# Patient Record
Sex: Female | Born: 1940 | Race: White | Hispanic: No | Marital: Married | Smoking: Never smoker
Health system: Southern US, Community
[De-identification: ages and names within clinical notes are randomized; demographics above are authoritative.]

## PROBLEM LIST (undated history)

## (undated) DIAGNOSIS — E785 Hyperlipidemia, unspecified: Secondary | ICD-10-CM

## (undated) DIAGNOSIS — C801 Malignant (primary) neoplasm, unspecified: Secondary | ICD-10-CM

## (undated) DIAGNOSIS — E119 Type 2 diabetes mellitus without complications: Secondary | ICD-10-CM

## (undated) DIAGNOSIS — M1990 Unspecified osteoarthritis, unspecified site: Secondary | ICD-10-CM

## (undated) DIAGNOSIS — F329 Major depressive disorder, single episode, unspecified: Secondary | ICD-10-CM

## (undated) DIAGNOSIS — I10 Essential (primary) hypertension: Secondary | ICD-10-CM

## (undated) DIAGNOSIS — F32A Depression, unspecified: Secondary | ICD-10-CM

## (undated) DIAGNOSIS — N189 Chronic kidney disease, unspecified: Secondary | ICD-10-CM

## (undated) HISTORY — DX: Essential (primary) hypertension: I10

## (undated) HISTORY — PX: APPENDECTOMY: SHX54

## (undated) HISTORY — DX: Unspecified osteoarthritis, unspecified site: M19.90

## (undated) HISTORY — DX: Type 2 diabetes mellitus without complications: E11.9

## (undated) HISTORY — PX: TONSILLECTOMY: SUR1361

## (undated) HISTORY — DX: Depression, unspecified: F32.A

## (undated) HISTORY — DX: Chronic kidney disease, unspecified: N18.9

## (undated) HISTORY — DX: Major depressive disorder, single episode, unspecified: F32.9

## (undated) HISTORY — DX: Hyperlipidemia, unspecified: E78.5

## (undated) HISTORY — DX: Malignant (primary) neoplasm, unspecified: C80.1

---

## 1997-09-11 ENCOUNTER — Other Ambulatory Visit: Admission: RE | Admit: 1997-09-11 | Discharge: 1997-09-11 | Payer: Self-pay | Admitting: *Deleted

## 1999-09-06 ENCOUNTER — Encounter: Payer: Self-pay | Admitting: *Deleted

## 1999-09-06 ENCOUNTER — Encounter: Admission: RE | Admit: 1999-09-06 | Discharge: 1999-09-06 | Payer: Self-pay | Admitting: *Deleted

## 2000-09-15 ENCOUNTER — Other Ambulatory Visit: Admission: RE | Admit: 2000-09-15 | Discharge: 2000-09-15 | Payer: Self-pay | Admitting: *Deleted

## 2000-11-06 ENCOUNTER — Ambulatory Visit (HOSPITAL_COMMUNITY): Admission: RE | Admit: 2000-11-06 | Discharge: 2000-11-06 | Payer: Self-pay | Admitting: Neurosurgery

## 2000-11-06 ENCOUNTER — Encounter: Payer: Self-pay | Admitting: Neurosurgery

## 2000-11-20 ENCOUNTER — Encounter: Payer: Self-pay | Admitting: Neurosurgery

## 2000-11-20 ENCOUNTER — Ambulatory Visit (HOSPITAL_COMMUNITY): Admission: RE | Admit: 2000-11-20 | Discharge: 2000-11-20 | Payer: Self-pay | Admitting: Neurosurgery

## 2001-02-16 ENCOUNTER — Ambulatory Visit (HOSPITAL_COMMUNITY): Admission: RE | Admit: 2001-02-16 | Discharge: 2001-02-16 | Payer: Self-pay | Admitting: Neurosurgery

## 2001-02-16 ENCOUNTER — Encounter: Payer: Self-pay | Admitting: Neurosurgery

## 2001-03-01 ENCOUNTER — Ambulatory Visit (HOSPITAL_COMMUNITY): Admission: RE | Admit: 2001-03-01 | Discharge: 2001-03-01 | Payer: Self-pay | Admitting: Neurosurgery

## 2001-03-01 ENCOUNTER — Encounter: Payer: Self-pay | Admitting: Neurosurgery

## 2001-09-23 ENCOUNTER — Other Ambulatory Visit: Admission: RE | Admit: 2001-09-23 | Discharge: 2001-09-23 | Payer: Self-pay | Admitting: Internal Medicine

## 2002-02-28 ENCOUNTER — Ambulatory Visit (HOSPITAL_COMMUNITY): Admission: RE | Admit: 2002-02-28 | Discharge: 2002-02-28 | Payer: Self-pay | Admitting: Neurosurgery

## 2002-02-28 ENCOUNTER — Encounter: Payer: Self-pay | Admitting: Neurosurgery

## 2002-06-15 ENCOUNTER — Encounter: Payer: Self-pay | Admitting: General Surgery

## 2002-06-15 ENCOUNTER — Ambulatory Visit (HOSPITAL_COMMUNITY): Admission: RE | Admit: 2002-06-15 | Discharge: 2002-06-15 | Payer: Self-pay | Admitting: General Surgery

## 2002-06-29 ENCOUNTER — Encounter (INDEPENDENT_AMBULATORY_CARE_PROVIDER_SITE_OTHER): Payer: Self-pay | Admitting: Specialist

## 2002-06-29 ENCOUNTER — Ambulatory Visit (HOSPITAL_BASED_OUTPATIENT_CLINIC_OR_DEPARTMENT_OTHER): Admission: RE | Admit: 2002-06-29 | Discharge: 2002-06-29 | Payer: Self-pay | Admitting: General Surgery

## 2002-06-29 ENCOUNTER — Encounter: Payer: Self-pay | Admitting: General Surgery

## 2002-08-03 ENCOUNTER — Ambulatory Visit (HOSPITAL_BASED_OUTPATIENT_CLINIC_OR_DEPARTMENT_OTHER): Admission: RE | Admit: 2002-08-03 | Discharge: 2002-08-04 | Payer: Self-pay | Admitting: General Surgery

## 2002-08-04 ENCOUNTER — Encounter (INDEPENDENT_AMBULATORY_CARE_PROVIDER_SITE_OTHER): Payer: Self-pay | Admitting: Specialist

## 2002-08-17 ENCOUNTER — Encounter: Payer: Self-pay | Admitting: Oncology

## 2002-08-17 ENCOUNTER — Encounter: Admission: RE | Admit: 2002-08-17 | Discharge: 2002-08-17 | Payer: Self-pay | Admitting: Oncology

## 2002-09-15 ENCOUNTER — Ambulatory Visit: Admission: RE | Admit: 2002-09-15 | Discharge: 2002-09-15 | Payer: Self-pay | Admitting: Oncology

## 2002-10-10 ENCOUNTER — Encounter: Payer: Self-pay | Admitting: Oncology

## 2002-10-10 ENCOUNTER — Ambulatory Visit (HOSPITAL_COMMUNITY): Admission: RE | Admit: 2002-10-10 | Discharge: 2002-10-10 | Payer: Self-pay | Admitting: Oncology

## 2002-10-18 ENCOUNTER — Other Ambulatory Visit: Admission: RE | Admit: 2002-10-18 | Discharge: 2002-10-18 | Payer: Self-pay | Admitting: Internal Medicine

## 2002-12-28 ENCOUNTER — Encounter: Admission: RE | Admit: 2002-12-28 | Discharge: 2002-12-28 | Payer: Self-pay | Admitting: Oncology

## 2003-07-19 ENCOUNTER — Encounter: Admission: RE | Admit: 2003-07-19 | Discharge: 2003-07-19 | Payer: Self-pay | Admitting: Oncology

## 2004-01-08 ENCOUNTER — Encounter: Admission: RE | Admit: 2004-01-08 | Discharge: 2004-01-08 | Payer: Self-pay | Admitting: Oncology

## 2004-02-01 ENCOUNTER — Other Ambulatory Visit: Admission: RE | Admit: 2004-02-01 | Discharge: 2004-02-01 | Payer: Self-pay | Admitting: *Deleted

## 2004-05-03 ENCOUNTER — Ambulatory Visit: Payer: Self-pay | Admitting: Oncology

## 2004-05-31 LAB — HM COLONOSCOPY: HM Colonoscopy: NORMAL

## 2004-07-08 ENCOUNTER — Encounter: Admission: RE | Admit: 2004-07-08 | Discharge: 2004-07-08 | Payer: Self-pay | Admitting: Oncology

## 2004-11-01 ENCOUNTER — Ambulatory Visit: Payer: Self-pay | Admitting: Oncology

## 2005-01-06 ENCOUNTER — Encounter: Admission: RE | Admit: 2005-01-06 | Discharge: 2005-01-06 | Payer: Self-pay | Admitting: Oncology

## 2005-05-02 ENCOUNTER — Ambulatory Visit: Payer: Self-pay | Admitting: Oncology

## 2005-05-12 ENCOUNTER — Other Ambulatory Visit: Admission: RE | Admit: 2005-05-12 | Discharge: 2005-05-12 | Payer: Self-pay | Admitting: Obstetrics & Gynecology

## 2006-01-01 ENCOUNTER — Ambulatory Visit: Payer: Self-pay | Admitting: Oncology

## 2006-01-05 ENCOUNTER — Ambulatory Visit (HOSPITAL_COMMUNITY): Admission: RE | Admit: 2006-01-05 | Discharge: 2006-01-05 | Payer: Self-pay | Admitting: Oncology

## 2006-09-10 ENCOUNTER — Other Ambulatory Visit: Admission: RE | Admit: 2006-09-10 | Discharge: 2006-09-10 | Payer: Self-pay | Admitting: Obstetrics & Gynecology

## 2006-12-31 ENCOUNTER — Ambulatory Visit: Payer: Self-pay | Admitting: Oncology

## 2007-01-04 ENCOUNTER — Ambulatory Visit (HOSPITAL_COMMUNITY): Admission: RE | Admit: 2007-01-04 | Discharge: 2007-01-04 | Payer: Self-pay | Admitting: Oncology

## 2007-07-01 ENCOUNTER — Ambulatory Visit: Payer: Self-pay | Admitting: Oncology

## 2008-01-07 ENCOUNTER — Ambulatory Visit: Payer: Self-pay | Admitting: Oncology

## 2008-01-11 ENCOUNTER — Ambulatory Visit (HOSPITAL_COMMUNITY): Admission: RE | Admit: 2008-01-11 | Discharge: 2008-01-11 | Payer: Self-pay | Admitting: Oncology

## 2009-01-22 ENCOUNTER — Ambulatory Visit: Payer: Self-pay | Admitting: Oncology

## 2009-05-25 ENCOUNTER — Encounter (INDEPENDENT_AMBULATORY_CARE_PROVIDER_SITE_OTHER): Payer: Self-pay | Admitting: *Deleted

## 2009-06-04 ENCOUNTER — Encounter: Admission: RE | Admit: 2009-06-04 | Discharge: 2009-06-04 | Payer: Self-pay | Admitting: Internal Medicine

## 2010-01-18 ENCOUNTER — Ambulatory Visit: Payer: Self-pay | Admitting: Oncology

## 2010-02-16 ENCOUNTER — Encounter: Payer: Self-pay | Admitting: Oncology

## 2010-02-17 ENCOUNTER — Encounter: Payer: Self-pay | Admitting: Oncology

## 2010-02-28 NOTE — Letter (Signed)
Summary: Colonoscopy Date Change Letter  [facility]  [address] [place], [place] [address]   Phone: [phone]  Fax: [phone]      May 25, 2009 MRN: [id number]   Lori Sharp  [address] [place], [place]  [address]   Dear Ms. GARTNER ,   Previously you were recommended to have a repeat colonoscopy around this time. Your chart was recently reviewed by Dr. Judie Petit T. Russella Dar of [facility]. Follow up colonoscopy is now recommended in May 2014. This revised recommendation is based on current, nationally recognized guidelines for colorectal cancer screening and polyp surveillance. These guidelines are endorsed by the American Cancer Society, [employer] on Colorectal Cancer as well as numerous other major medical organizations.  Please understand that our recommendation assumes that you do not have any new symptoms such as bleeding, a change in bowel habits, anemia, or significant abdominal discomfort. If you do have any concerning GI symptoms or want to discuss the guideline recommendations, please call to arrange an office visit at your earliest convenience. Otherwise we will keep you in our reminder system and contact you 1-2 months prior to the date listed above to schedule your next colonoscopy.  Thank you,  Judie Petit T. Russella Dar, M.D.  [facility] [phone]

## 2010-06-14 NOTE — Op Note (Signed)
NAME:  Lori Sharp, Lori Sharp                      ACCOUNT NO.:  [id number]   MEDICAL RECORD NO.:  [id number]                   PATIENT TYPE:  AMB   LOCATION:  DSC                                  FACILITY:  [facility]   PHYSICIAN:  Rose Phi. Maple Hudson, M.D.                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   OPERATION:  Completion of right axillary lymph node dissection.   SURGEON:  Rose Phi. Maple Hudson, M.D.   ANESTHESIA:  General.   INDICATIONS FOR PROCEDURE:  This patient had presented with a melanoma of  the right supraclavicular area which had been excised by Dr. Karlyn Agee. This  showed the original being about 0.98 mm. The sentinel lymph node had a  microscopic focus of cells not visible on H and E but present on the  immunohistochemical stains, and for that reason we are doing a completion  axillary node dissection.   DESCRIPTION OF PROCEDURE:  After suitable general anesthesia was induced the  patient was placed in the supine position with the right arm extended on the  arm board. The right axilla was then prepped and draped out in the standard  fashion.   The axillary incision which was made for the sentinel lymph node biopsy was  then extended medially and laterally and we then exposed the pectoralis  major muscle. I then dissected along the pectoralis major retracting it and  exposing the clavipectoral fascia over the axillary vein. I divided that and  exposed the pectoralis minor which we dissected and retracted and then  dissected out of the axillary tissue from inferior to the vein and then from  beneath the pectoralis minor muscle doing a complete level 1 and level 2  node dissection with  preservation of the long thoracic and thoracodorsal  nerves. The other  vessels and nerves were clipped and divided.  A level 3  dissection was not appropriate here with minimal microscopic focus in a  lymph node.   Following  the completion of the dissection we had good hemostasis. We  thoroughly irrigated the field with saline. A 19 [place] Blake drain was  inserted and brought out through a separate stab wound. The subcutaneous  tissue was closed with 3-0 Vicryl and the skin  with staples. A dressing was  applied.   The patient was transferred to the recovery room in satisfactory condition.  She tolerated  the procedure well.  Rose Phi. Maple Hudson, M.D.    PRY/MEDQ  D:  08/03/2002  T:  08/03/2002  Job:  [id number]   cc:   Jillyn Hidden B. Truett Perna, M.D.  [address] [facility]  [place]  [place]  [address]  Fax: [phone]   Garrison Columbus. Yetta Barre, M.D.  [address] [place]  [place] [address]  Fax: [phone]   Lovenia Kim, D.O.  [address], [place]  [place]  [place] [address]  Fax: [phone]

## 2010-06-14 NOTE — Op Note (Signed)
NAME:  Lori Sharp, Lori Sharp                      ACCOUNT NO.:  [id number]   MEDICAL RECORD NO.:  [id number]                   PATIENT TYPE:  AMB   LOCATION:  DSC                                  FACILITY:  [facility]   PHYSICIAN:  Rose Phi. Maple Hudson, M.D.                DATE OF BIRTH:  May 25, 1940   DATE OF PROCEDURE:  06/29/2002  DATE OF DISCHARGE:                                 OPERATIVE REPORT   PREOPERATIVE DIAGNOSIS:  Melanoma of the right supraclavicular area.   POSTOPERATIVE DIAGNOSIS:  Melanoma of the right supraclavicular area.   OPERATION PERFORMED:  1. Blue dye injection.  2. Right axillary sentinel lymph node biopsy.  3. Wide excision of melanoma of right supraclavicular space.   SURGEON:  Rose Phi. Maple Hudson, M.D.   ANESTHESIA:  General.   DESCRIPTION OF PROCEDURE:  This patient had presented with a melanoma that  had been biopsied and was 0.98 mm thick without ulceration.  A preoperative  lymphoscintigram had identified the right axilla as the site of her sentinel  lymph nodes.  She was then scheduled for her surgery.   After suitable general anesthesia was induced, the patient was placed in the  supine position.  I injected 1mL of Lymphazurin blue intradermally around  the previous melanoma biopsy site. We then prepped the axilla and neck and  draped out in a standard fashion.  Careful scanning of the axilla revealed a  hot spot with the Neoprobe.  A short axillary incision was made with  dissection through the subcutaneous tissue to the clavipectoral fascia.  Just beneath the clavipectoral fascia was a quite hot lymph node with counts  in excess of 600.  It did not have any blue dye in it.  I excised that node  as a sentinel node.  Careful palpation and scanning revealed no other hot or  palpable nodes.  That incision was then closed with 3-0 Vicryl and  subcuticular 4-0 Monocryl with Steri-Strips.   We then outlined a 1 cm margin of excision around the supraclavicular  scar  from her previous excision.  I then excised that.  Hemostasis was obtained  with the cautery.  This gave a 3 x 5 cm defect.  It was closed in a single  layer of interrupted 4-0 nylon sutures.  Dressings applied.  The patient was  then transferred to the recovery room in satisfactory condition having  tolerated the procedure well.                                                Rose Phi. Maple Hudson, M.D.    PRY/MEDQ  D:  06/29/2002  T:  06/29/2002  Job:  [id number]   cc:   Lovenia Kim, D.O.  [address]  [place] [address]  Fax: [phone]   Garrison Columbus. Yetta Barre, M.D.  [address] [place]  [place] [address]  Fax: [phone]

## 2010-12-20 ENCOUNTER — Telehealth: Payer: Self-pay | Admitting: Oncology

## 2010-12-20 NOTE — Telephone Encounter (Signed)
S/w the pt and she is aware of her r/s dec appts to [facility] 2013

## 2011-01-27 ENCOUNTER — Ambulatory Visit: Payer: Self-pay | Admitting: Oncology

## 2011-02-04 ENCOUNTER — Ambulatory Visit: Payer: Self-pay | Admitting: Oncology

## 2011-02-06 ENCOUNTER — Ambulatory Visit (HOSPITAL_BASED_OUTPATIENT_CLINIC_OR_DEPARTMENT_OTHER): Payer: Self-pay | Admitting: Oncology

## 2011-02-06 VITALS — BP 144/69 | HR 77 | Temp 97.1°F | Ht 61.5 in | Wt 149.3 lb

## 2011-02-06 DIAGNOSIS — C439 Malignant melanoma of skin, unspecified: Secondary | ICD-10-CM

## 2011-02-06 DIAGNOSIS — Z85820 Personal history of malignant melanoma of skin: Secondary | ICD-10-CM

## 2011-02-06 NOTE — Progress Notes (Signed)
OFFICE PROGRESS NOTE   INTERVAL HISTORY:   She returns as scheduled. She continues to work Chief Technology Officer. There is no edema at the right arm. She continues followup with Dr. Yetta Barre. She has a good appetite and energy level.  She is followed closely by Dr. Ludwig Clarks and reports she discovered a mild renal dysfunction.  Objective:  Vital signs in last 24 hours:  Blood pressure 144/69, pulse 77, temperature 97.1 F (36.2 C), temperature source Oral, height 5' 1.5" (1.562 m), weight 149 lb 4.8 oz (67.722 kg).    HEENT: Neck without mass Lymphatics: No cervical, supraclavicular, or axillary nodes Resp: Lungs clear bilaterally Cardio: Regular rate and rhythm GI: The abdomen is nontender. No hepatomegaly. Vascular: No leg edema. The right arm is slightly larger than the left side. No hand edema.  Skin: Right lower neck scar without evidence of local tumor recurrence.    Medications: I have reviewed the patient's current medications.  Assessment/Plan: Ms Rotundo was diagnosed with stage III melanoma in May 2004.  There is no clinical evidence of recurrent melanoma.    Disposition:  She remains in remission from the melanoma. She plans to continue clinical followup with Dr. Yetta Barre and Dr.Moreira. We did not schedule a followup appointment at the cancer Center. We will see her in the future as needed.   Lucile Shutters, MD  02/06/2011  5:18 PM

## 2012-01-22 ENCOUNTER — Ambulatory Visit
Admission: RE | Admit: 2012-01-22 | Discharge: 2012-01-22 | Disposition: A | Discharge status: Home / Self Care | Payer: Medicare Other | Source: Ambulatory Visit | Attending: Internal Medicine | Admitting: Internal Medicine

## 2012-01-22 ENCOUNTER — Other Ambulatory Visit: Payer: Self-pay | Admitting: Internal Medicine

## 2012-01-22 DIAGNOSIS — R609 Edema, unspecified: Secondary | ICD-10-CM

## 2012-03-01 LAB — HM MAMMOGRAPHY: HM Mammogram: NORMAL

## 2012-06-01 ENCOUNTER — Encounter: Payer: Self-pay | Admitting: Gastroenterology

## 2013-09-02 LAB — BASIC METABOLIC PANEL
Creatinine: 1 mg/dL (ref <=1.1)
Glucose: 99 mg/dL
Potassium: 3.6 mmol/L (ref 3.4–5.3)
Sodium: 136 mmol/L — AB (ref 137–147)

## 2013-09-02 LAB — HEPATIC FUNCTION PANEL
ALT: 14 U/L (ref 7–35)
AST: 16 U/L (ref 13–35)
Alkaline Phosphatase: 48 U/L (ref 25–125)
Bilirubin, Total: 0.8 mg/dL

## 2013-09-02 LAB — HEMOGLOBIN A1C: Hgb A1c MFr Bld: 5.9 % (ref 4.0–6.0)

## 2013-09-02 LAB — LIPID PANEL
Cholesterol: 166 mg/dL (ref 0–200)
HDL: 54 mg/dL (ref 35–70)
LDL Cholesterol: 84 mg/dL
Triglycerides: 141 mg/dL (ref 40–160)

## 2013-09-10 LAB — HEMOGLOBIN A1C: Hgb A1c MFr Bld: 5.9 % (ref 4.0–6.0)

## 2013-09-23 ENCOUNTER — Encounter: Payer: Self-pay | Admitting: Family Medicine

## 2013-09-23 DIAGNOSIS — C439 Malignant melanoma of skin, unspecified: Secondary | ICD-10-CM

## 2013-09-23 DIAGNOSIS — F329 Major depressive disorder, single episode, unspecified: Secondary | ICD-10-CM

## 2013-09-23 DIAGNOSIS — N183 Chronic kidney disease, stage 3 unspecified: Secondary | ICD-10-CM

## 2013-09-23 DIAGNOSIS — R739 Hyperglycemia, unspecified: Secondary | ICD-10-CM | POA: Insufficient documentation

## 2013-09-23 DIAGNOSIS — I159 Secondary hypertension, unspecified: Secondary | ICD-10-CM

## 2013-09-23 DIAGNOSIS — I10 Essential (primary) hypertension: Secondary | ICD-10-CM | POA: Insufficient documentation

## 2013-09-23 DIAGNOSIS — E785 Hyperlipidemia, unspecified: Secondary | ICD-10-CM | POA: Insufficient documentation

## 2013-09-23 DIAGNOSIS — N1330 Unspecified hydronephrosis: Secondary | ICD-10-CM | POA: Insufficient documentation

## 2013-09-23 DIAGNOSIS — I8390 Asymptomatic varicose veins of unspecified lower extremity: Secondary | ICD-10-CM | POA: Insufficient documentation

## 2013-09-23 DIAGNOSIS — F330 Major depressive disorder, recurrent, mild: Secondary | ICD-10-CM | POA: Insufficient documentation

## 2013-09-23 DIAGNOSIS — F32A Depression, unspecified: Secondary | ICD-10-CM

## 2013-09-23 DIAGNOSIS — Z85820 Personal history of malignant melanoma of skin: Secondary | ICD-10-CM | POA: Insufficient documentation

## 2013-09-28 ENCOUNTER — Telehealth: Payer: Self-pay

## 2013-09-28 NOTE — Telephone Encounter (Signed)
Pharmacy is requesting refill on pt Vit D2 1.25mg  #4, take 1 capsule every week. Is this ok to fill?

## 2013-09-28 NOTE — Telephone Encounter (Signed)
Please let patient we will check vitamin D at her first visit or first time we do labs and make a decision on if she needs weekly therapy at that time or if she is adequately repleted.

## 2013-09-28 NOTE — Telephone Encounter (Signed)
Pt states that she got her other doctor to fill this who origianlly Rxd it for her

## 2013-10-11 ENCOUNTER — Ambulatory Visit (INDEPENDENT_AMBULATORY_CARE_PROVIDER_SITE_OTHER): Payer: Medicare Other | Admitting: Internal Medicine

## 2013-10-11 VITALS — BP 138/78 | HR 86 | Temp 98.1°F | Resp 18 | Ht 61.0 in | Wt 133.6 lb

## 2013-10-11 DIAGNOSIS — R634 Abnormal weight loss: Secondary | ICD-10-CM

## 2013-10-11 DIAGNOSIS — F329 Major depressive disorder, single episode, unspecified: Secondary | ICD-10-CM

## 2013-10-11 DIAGNOSIS — Z818 Family history of other mental and behavioral disorders: Secondary | ICD-10-CM

## 2013-10-11 DIAGNOSIS — F32A Depression, unspecified: Secondary | ICD-10-CM

## 2013-10-11 DIAGNOSIS — F3289 Other specified depressive episodes: Secondary | ICD-10-CM

## 2013-10-11 DIAGNOSIS — R42 Dizziness and giddiness: Secondary | ICD-10-CM

## 2013-10-11 LAB — POCT CBC
Granulocyte percent: 63.9 %G (ref 37–80)
HCT, POC: 40 % (ref 37.7–47.9)
Hemoglobin: 13.3 g/dL (ref 12.2–16.2)
Lymph, poc: 2.3 (ref 0.6–3.4)
MCH, POC: 28.8 pg (ref 27–31.2)
MCHC: 33.2 g/dL (ref 31.8–35.4)
MCV: 86.8 fL (ref 80–97)
MID (cbc): 0.5 (ref 0–0.9)
MPV: 7.2 fL (ref 0–99.8)
POC Granulocyte: 5 (ref 2–6.9)
POC LYMPH PERCENT: 29.2 %L (ref 10–50)
POC MID %: 6.9 %M (ref 0–12)
Platelet Count, POC: 241 10*3/uL (ref 142–424)
RBC: 4.61 M/uL (ref 4.04–5.48)
RDW, POC: 13 %
WBC: 7.9 10*3/uL (ref 4.6–10.2)

## 2013-10-11 LAB — POCT SEDIMENTATION RATE: POCT SED RATE: 30 mm/hr — AB (ref 0–22)

## 2013-10-11 MED ORDER — ESCITALOPRAM OXALATE 20 MG PO TABS: 20.0000 mg | ORAL_TABLET | Freq: Every day | ORAL | Status: DC

## 2013-10-11 NOTE — Progress Notes (Signed)
Subjective:    Patient ID: Lori Sharp, female    DOB: [birth year], 73 y.o.   MRN: [id number]  HPI  73 year old female married and lives with husband who is not well per patient.  Pt presents to clinic today with complaints of questioning pt's anti-depressant medication is not working and needs to be increased. For the 2 months pt has noticed she is not eating well and over the past 2 weeks pt states he has been loosing weight.  Current weight 133.6 lbs in clinic fully dressed today.  Weight in January 2013 was 149 lbs when seen by oncologist Dr. Benay Spice.  Pt states she is dizzy and feels she cries all the time with shakiness and weakness.  Pt feels her dizziness is not walking straight, feels she spins - not the room spinning.  Pt feels tearful all the time without any particular stimulus.  Pt states she has been on antidepressants since her cancer was diagnosed in 2004.  Pt takes Celexa 10mg  at bedtime - states it makes her sleepy and does not take in daytime. Celexa is prescribed by Dr. Benay Spice and Dr. Baldomero Lamy -former primary care MD.  Dr. Benay Spice saw pt for stage 3 melanoma of the Right shoulder/arm.  Pt states she had 16 lymph nodes removed from right axillary area.  Pt to see Dr.[facility]unter [facility] November 21, 2013 to establish primary care with this practice due to close proximity to where pt lives - pt does not drive and husband unable to drive at this time either.       Review of Systems     Objective:   Physical Exam  Constitutional: She is oriented to person, place, and time. She appears well-developed and well-nourished. She appears distressed.  HENT:  Head: Normocephalic.  Nose: Nose normal.  Eyes: Conjunctivae and EOM are normal. Pupils are equal, round, and reactive to light.  Neck: Normal range of motion. Neck supple.  Cardiovascular: Normal rate and normal heart sounds.   Pulmonary/Chest: Effort normal and breath sounds normal.  Abdominal: Soft.    Musculoskeletal: Normal range of motion.  Neurological: She is alert and oriented to person, place, and time. No cranial nerve deficit. She exhibits normal muscle tone. Coordination normal.  Psychiatric: Her speech is normal and behavior is normal. Judgment and thought content normal. Cognition and memory are normal. She exhibits a depressed mood.   BP 132/78 Results for orders placed in visit on 10/11/13  POCT CBC      Result Value Ref Range   WBC 7.9  4.6 - 10.2 K/uL   Lymph, poc 2.3  0.6 - 3.4   POC LYMPH PERCENT 29.2  10 - 50 %L   MID (cbc) 0.5  0 - 0.9   POC MID % 6.9  0 - 12 %M   POC Granulocyte 5.0  2 - 6.9   Granulocyte percent 63.9  37 - 80 %G   RBC 4.61  4.04 - 5.48 M/uL   Hemoglobin 13.3  12.2 - 16.2 g/dL   HCT, POC 40.0  37.7 - 47.9 %   MCV 86.8  80 - 97 fL   MCH, POC 28.8  27 - 31.2 pg   MCHC 33.2  31.8 - 35.4 g/dL   RDW, POC 13.0     Platelet Count, POC 241  142 - 424 K/uL   MPV 7.2  0 - 99.8 fL         Assessment & Plan:  Dizzy/Melanoma/Depression/Family stress  Trial of brand Lexapro 20mg  qd Support group for dementia Exercise/Church group

## 2013-10-11 NOTE — Progress Notes (Deleted)
   Subjective:    Patient ID: Lori Sharp, female    DOB: [birth year], 73 y.o.   MRN: [id number]  HPI    Review of Systems     Objective:   Physical Exam        Assessment & Plan:

## 2013-10-11 NOTE — Patient Instructions (Addendum)
Adjustment Disorder Most changes in life can cause stress. Getting used to changes may take a few months or longer. If feelings of stress, hopelessness, or worry continue, you may have an adjustment disorder. This stress-related mental health problem may affect your feelings, thinking and how you act. It occurs in both sexes and happens at any age. SYMPTOMS  Some of the following problems may be seen and vary from person to person:  Sadness or depression.  Loss of enjoyment.  Thoughts of suicide.  Fighting.  Avoiding family and friends.  Poor school performance.  Hopelessness, sense of loss.  Trouble sleeping.  Vandalism.  Worry, weight loss or gain.  Crying spells.  Anxiety  Reckless driving.  Skipping school.  Poor work Systems analyst.  Nervousness.  Ignoring bills.  Poor attitude. DIAGNOSIS  Your caregiver will ask what has happened in your life and do a physical exam. They will make a diagnosis of an adjustment disorder when they are sure another problem or medical illness causing your feelings does not exist. TREATMENT  When problems caused by stress interfere with you daily life or last longer than a few months, you may need counseling for an adjustment disorder. Early treatment may diminish problems and help you to better cope with the stressful events in your life. Sometimes medication is necessary. Individual counseling and or support groups can be very helpful. PROGNOSIS  Adjustment disorders usually last less than 3 to 6 months. The condition may persist if there is long lasting stress. This could include health problems, relationship problems, or job difficulties where you can not easily escape from what is causing the problem. PREVENTION  Even the most mentally healthy, highly functioning people can suffer from an adjustment disorder given a significant blow from a life-changing event. There is no way to prevent pain and loss. Most people need help from time  to time. You are not alone. SEEK MEDICAL CARE IF:  Your feelings or symptoms listed above do not improve or worsen. Document Released: 09/17/2005 Document Revised: 04/07/2011 Document Reviewed: 12/09/2006 [facility] Patient Information 2015 [place], [place]. This information is not intended to replace advice given to you by your health care provider. Make sure you discuss any questions you have with your health care provider. Stress Stress-related medical problems are becoming increasingly common. The body has a built-in physical response to stressful situations. Faced with pressure, challenge or danger, we need to react quickly. Our bodies release hormones such as cortisol and adrenaline to help do this. These hormones are part of the "fight or flight" response and affect the metabolic rate, heart rate and blood pressure, resulting in a heightened, stressed state that prepares the body for optimum performance in dealing with a stressful situation. It is likely that early man required these mechanisms to stay alive, but usually modern stresses do not call for this, and the same hormones released in today's world can damage health and reduce coping ability. CAUSES  Pressure to perform at work, at school or in sports.  Threats of physical violence.  Money worries.  Arguments.  Family conflicts.  Divorce or separation from significant other.  Bereavement.  New job or unemployment.  Changes in location.  Alcohol or drug abuse. SOMETIMES, THERE IS NO PARTICULAR REASON FOR DEVELOPING STRESS. Almost all people are at risk of being stressed at some time in their lives. It is important to know that some stress is temporary and some is long term.  Temporary stress will go away when a situation is  resolved. Most people can cope with short periods of stress, and it can often be relieved by relaxing, taking a walk or getting any type of exercise, chatting through issues with friends, or having a  good night's sleep.  Chronic (long-term, continuous) stress is much harder to deal with. It can be psychologically and emotionally damaging. It can be harmful both for an individual and for friends and family. SYMPTOMS Everyone reacts to stress differently. There are some common effects that help [place] recognize it. In times of extreme stress, people may:  Shake uncontrollably.  Breathe faster and deeper than normal (hyperventilate).  Vomit.  For people with asthma, stress can trigger an attack.  For some people, stress may trigger migraine headaches, ulcers, and body pain. PHYSICAL EFFECTS OF STRESS MAY INCLUDE:  Loss of energy.  Skin problems.  Aches and pains resulting from tense muscles, including neck ache, backache and tension headaches.  Increased pain from arthritis and other conditions.  Irregular heart beat (palpitations).  Periods of irritability or anger.  Apathy or depression.  Anxiety (feeling uptight or worrying).  Unusual behavior.  Loss of appetite.  Comfort eating.  Lack of concentration.  Loss of, or decreased, sex-drive.  Increased smoking, drinking, or recreational drug use.  For women, missed periods.  Ulcers, joint pain, and muscle pain. Post-traumatic stress is the stress caused by any serious accident, strong emotional damage, or extremely difficult or violent experience such as rape or war. Post-traumatic stress victims can experience mixtures of emotions such as fear, shame, depression, guilt or anger. It may include recurrent memories or images that may be haunting. These feelings can last for weeks, months or even years after the traumatic event that triggered them. Specialized treatment, possibly with medicines and psychological therapies, is available. If stress is causing physical symptoms, severe distress or making it difficult for you to function as normal, it is worth seeing your caregiver. It is important to remember that although  stress is a usual part of life, extreme or prolonged stress can lead to other illnesses that will need treatment. It is better to visit a doctor sooner rather than later. Stress has been linked to the development of high blood pressure and heart disease, as well as insomnia and depression. There is no diagnostic test for stress since everyone reacts to it differently. But a caregiver will be able to spot the physical symptoms, such as:  Headaches.  Shingles.  Ulcers. Emotional distress such as intense worry, low mood or irritability should be detected when the doctor asks pertinent questions to identify any underlying problems that might be the cause. In case there are physical reasons for the symptoms, the doctor may also want to do some tests to exclude certain conditions. If you feel that you are suffering from stress, try to identify the aspects of your life that are causing it. Sometimes you may not be able to change or avoid them, but even a small change can have a positive ripple effect. A simple lifestyle change can make all the difference. STRATEGIES THAT CAN HELP DEAL WITH STRESS:  Delegating or sharing responsibilities.  Avoiding confrontations.  Learning to be more assertive.  Regular exercise.  Avoid using alcohol or street drugs to cope.  Eating a healthy, balanced diet, rich in fruit and vegetables and proteins.  Finding humor or absurdity in stressful situations.  Never taking on more than you know you can handle comfortably.  Organizing your time better to get as much done as possible.  Talking to friends or family and sharing your thoughts and fears.  Listening to music or relaxation tapes.  Relaxation techniques like deep breathing, meditation, and yoga.  Tensing and then relaxing your muscles, starting at the toes and working up to the head and neck. If you think that you would benefit from help, either in identifying the things that are causing your stress or  in learning techniques to help you relax, see a caregiver who is capable of helping you with this. Rather than relying on medications, it is usually better to try and identify the things in your life that are causing stress and try to deal with them. There are many techniques of managing stress including counseling, psychotherapy, aromatherapy, yoga, and exercise. Your caregiver can help you determine what is best for you. Document Released: 04/05/2002 Document Revised: 01/18/2013 Document Reviewed: 03/02/2007 [facility] Patient Information 2015 [place], [place]. This information is not intended to replace advice given to you by your health care provider. Make sure you discuss any questions you have with your health care provider. Depression Depression refers to feeling sad, low, down in the dumps, blue, gloomy, or empty. In general, there are two kinds of depression: 1. Normal sadness or normal grief. This kind of depression is one that we all feel from time to time after upsetting life experiences, such as the loss of a job or the ending of a relationship. This kind of depression is considered normal, is short lived, and resolves within a few days to 2 weeks. Depression experienced after the loss of a loved one (bereavement) often lasts longer than 2 weeks but normally gets better with time. 2. Clinical depression. This kind of depression lasts longer than normal sadness or normal grief or interferes with your ability to function at home, at work, and in school. It also interferes with your personal relationships. It affects almost every aspect of your life. Clinical depression is an illness. Symptoms of depression can also be caused by conditions other than those mentioned above, such as:  Physical illness. Some physical illnesses, including underactive thyroid gland (hypothyroidism), severe anemia, specific types of cancer, diabetes, uncontrolled seizures, heart and lung problems, strokes, and chronic  pain are commonly associated with symptoms of depression.  Side effects of some prescription medicine. In some people, certain types of medicine can cause symptoms of depression.  Substance abuse. Abuse of alcohol and illicit drugs can cause symptoms of depression. SYMPTOMS Symptoms of normal sadness and normal grief include the following:  Feeling sad or crying for short periods of time.  Not caring about anything (apathy).  Difficulty sleeping or sleeping too much.  No longer able to enjoy the things you used to enjoy.  Desire to be by oneself all the time (social isolation).  Lack of energy or motivation.  Difficulty concentrating or remembering.  Change in appetite or weight.  Restlessness or agitation. Symptoms of clinical depression include the same symptoms of normal sadness or normal grief and also the following symptoms:  Feeling sad or crying all the time.  Feelings of guilt or worthlessness.  Feelings of hopelessness or helplessness.  Thoughts of suicide or the desire to harm yourself (suicidal ideation).  Loss of touch with reality (psychotic symptoms). Seeing or hearing things that are not real (hallucinations) or having false beliefs about your life or the people around you (delusions and paranoia). DIAGNOSIS  The diagnosis of clinical depression is usually based on how bad the symptoms are and how long they have lasted.  Your health care provider will also ask you questions about your medical history and substance use to find out if physical illness, use of prescription medicine, or substance abuse is causing your depression. Your health care provider may also order blood tests. TREATMENT  Often, normal sadness and normal grief do not require treatment. However, sometimes antidepressant medicine is given for bereavement to ease the depressive symptoms until they resolve. The treatment for clinical depression depends on how bad the symptoms are but often includes  antidepressant medicine, counseling with a mental health professional, or both. Your health care provider will help to determine what treatment is best for you. Depression caused by physical illness usually goes away with appropriate medical treatment of the illness. If prescription medicine is causing depression, talk with your health care provider about stopping the medicine, decreasing the dose, or changing to another medicine. Depression caused by the abuse of alcohol or illicit drugs goes away when you stop using these substances. Some adults need professional help in order to stop drinking or using drugs. SEEK IMMEDIATE MEDICAL CARE IF:  You have thoughts about hurting yourself or others.  You lose touch with reality (have psychotic symptoms).  You are taking medicine for depression and have a serious side effect. FOR MORE INFORMATION  National Alliance on Mental Illness: www.nami.[employer]: https://carter.com/ Document Released: 01/11/2000 Document Revised: 05/30/2013 Document Reviewed: 04/14/2011 [facility] Patient Information 2015 [place], [place]. This information is not intended to replace advice given to you by your health care provider. Make sure you discuss any questions you have with your health care provider. Dementia Dementia is a general term for problems with brain function. A person with dementia has memory loss and a hard time with at least one other brain function such as thinking, speaking, or problem solving. Dementia can affect social functioning, how you do your job, your mood, or your personality. The changes may be hidden for a long time. The earliest forms of this disease are usually not detected by family or friends. Dementia can be:  Irreversible.  Potentially reversible.  Partially reversible.  Progressive. This means it can get worse over time. CAUSES  Irreversible dementia causes may include:  Degeneration of brain cells  (Alzheimer disease or Lewy body dementia).  Multiple small strokes (vascular dementia).  Infection (chronic meningitis or Creutzfeldt-Jakob disease).  Frontotemporal dementia. This affects younger people, age 94 to 52, compared to those who have Alzheimer disease.  Dementia associated with other disorders like Parkinson disease, Huntington disease, or HIV-associated dementia. Potentially or partially reversible dementia causes may include:  Medicines.  Metabolic causes such as excessive alcohol intake, vitamin B12 deficiency, or thyroid disease.  Masses or pressure in the brain such as a tumor, blood clot, or hydrocephalus. SIGNS AND SYMPTOMS  Symptoms are often hard to detect. Family members or coworkers may not notice them early in the disease process. Different people with dementia may have different symptoms. Symptoms can include:  A hard time with memory, especially recent memory. Long-term memory may not be impaired.  Asking the same question multiple times or forgetting something someone just said.  A hard time speaking your thoughts or finding certain words.  A hard time solving problems or performing familiar tasks (such as how to use a telephone).  Sudden changes in mood.  Changes in personality, especially increasing moodiness or mistrust.  Depression.  A hard time understanding complex ideas that were never a problem in the past. DIAGNOSIS  There are  no specific tests for dementia.   Your health care provider may recommend a thorough evaluation. This is because some forms of dementia can be reversible. The evaluation will likely include a physical exam and getting a detailed history from you and a family member. The history often gives the best clues and suggestions for a diagnosis.  Memory testing may be done. A detailed brain function evaluation called neuropsychologic testing may be helpful.  Lab tests and brain imaging (such as a CT scan or MRI scan) are  sometimes important.  Sometimes observation and re-evaluation over time is very helpful. TREATMENT  Treatment depends on the cause.   If the problem is a vitamin deficiency, it may be helped or cured with supplements.  For dementias such as Alzheimer disease, medicines are available to stabilize or slow the course of the disease. There are no cures for this type of dementia.  Your health care provider can help direct you to groups, organizations, and other health care providers to help with decisions in the care of you or your loved one. HOME CARE INSTRUCTIONS The care of individuals with dementia is varied and dependent upon the progression of the dementia. The following suggestions are intended for the person living with, or caring for, the person with dementia.  Create a safe environment.  Remove the locks on bathroom doors to prevent the person from accidentally locking himself or herself in.  Use childproof latches on kitchen cabinets and any place where cleaning supplies, chemicals, or alcohol are kept.  Use childproof covers in unused electrical outlets.  Install childproof devices to keep doors and windows secured.  Remove stove knobs or install safety knobs and an automatic shut-off on the stove.  Lower the temperature on water heaters.  Label medicines and keep them locked up.  Secure knives, lighters, matches, power tools, and guns, and keep these items out of reach.  Keep the house free from clutter. Remove rugs or anything that might contribute to a fall.  Remove objects that might break and hurt the person.  Make sure lighting is good, both inside and outside.  Install grab rails as needed.  Use a monitoring device to alert you to falls or other needs for help.  Reduce confusion.  Keep familiar objects and people around.  Use night lights or dim lights at night.  Label items or areas.  Use reminders, notes, or directions for daily activities or  tasks.  Keep a simple, consistent routine for waking, meals, bathing, dressing, and bedtime.  Create a calm, quiet environment.  Place large clocks and calendars prominently.  Display emergency numbers and home address near all telephones.  Use cues to establish different times of the day. An example is to open curtains to let the natural light in during the day.   Use effective communication.  Choose simple words and short sentences.  Use a gentle, calm tone of voice.  Be careful not to interrupt.  If the person is struggling to find a word or communicate a thought, try to provide the word or thought.  Ask one question at a time. Allow the person ample time to answer questions. Repeat the question again if the person does not respond.  Reduce nighttime restlessness.  Provide a comfortable bed.  Have a consistent nighttime routine.  Ensure a regular walking or physical activity schedule. Involve the person in daily activities as much as possible.  Limit napping during the day.  Limit caffeine.  Attend social events that  stimulate rather than overwhelm the senses.  Encourage good nutrition and hydration.  Reduce distractions during meal times and snacks.  Avoid foods that are too hot or too cold.  Monitor chewing and swallowing ability.  Continue with routine vision, hearing, dental, and medical screenings.  Give medicines only as directed by the health care provider.  Monitor driving abilities. Do not allow the person to drive when safe driving is no longer possible.  Register with an identification program which could provide location assistance in the event of a missing person situation. SEEK MEDICAL CARE IF:   New behavioral problems start such as moodiness, aggressiveness, or seeing things that are not there (hallucinations).  Any new problem with brain function happens. This includes problems with balance, speech, or falling a lot.  Problems with  swallowing develop.  Any symptoms of other illness happen. Small changes or worsening in any aspect of brain function can be a sign that the illness is getting worse. It can also be a sign of another medical illness such as infection. Seeing a health care provider right away is important. SEEK IMMEDIATE MEDICAL CARE IF:   A fever develops.  New or worsened confusion develops.  New or worsened sleepiness develops.  Staying awake becomes hard to do. Document Released: 07/09/2000 Document Revised: 05/30/2013 Document Reviewed: 06/10/2010 [facility] Patient Information 2015 [place], [place]. This information is not intended to replace advice given to you by your health care provider. Make sure you discuss any questions you have with your health care provider.

## 2013-10-24 ENCOUNTER — Ambulatory Visit (INDEPENDENT_AMBULATORY_CARE_PROVIDER_SITE_OTHER): Payer: Medicare Other | Admitting: Family Medicine

## 2013-10-24 ENCOUNTER — Telehealth: Payer: Self-pay | Admitting: Physician Assistant

## 2013-10-24 VITALS — BP 132/62 | HR 72 | Temp 97.8°F | Resp 16 | Ht 61.0 in | Wt 132.0 lb

## 2013-10-24 DIAGNOSIS — Z23 Encounter for immunization: Secondary | ICD-10-CM

## 2013-10-24 DIAGNOSIS — F411 Generalized anxiety disorder: Secondary | ICD-10-CM

## 2013-10-24 MED ORDER — ALPRAZOLAM 0.25 MG PO TABS: 0.2500 mg | ORAL_TABLET | Freq: Three times a day (TID) | ORAL | Status: DC | PRN

## 2013-10-24 NOTE — Telephone Encounter (Signed)
Patient called to ask if it was OK for her to take the alprazolam along with her Lexapro in the mornings. Advised that it is OK to take these two together, and that she should use caution, as alprazolam can cause her to feel sleepy.

## 2013-10-24 NOTE — Progress Notes (Signed)
[facility] [address] [place] [address] [phone]  Date:  10/24/2013   Name:  Lori Sharp   DOB:  November 24, 1940   MRN:  [id number]  PCP:  No PCP Per Patient    Chief Complaint: Nausea and Fatigue   History of Present Illness:  Lori Sharp is a 73 y.o. very pleasant female patient who presents with the following:  She was seen her a couple of weeks ago with depression and fatigue. She notes that she still has a hard time eating, and is still losing weight.   No abdominal pain, but she sometimes feels so nervous that she cannot eat.  She will feel nauseated, dizzy, and "I'm just so weak."  She is not sure if she is truly nauseated or just nervous.   She is taking generic lexapro- 20 mg right now. She has been taking this for about 10 years now.  We recently tried to put her on brand, but it was too expensive.  Over the last 3 months or so there have been some major changes in her life.  They moved to an appt from the home where she had her husband had lived for 40 years.  Her husband is 39 and has speech aphasia.  They are in an independent appt with their pets.  She feels overwhelmed by her current situation.  She denies any SI   Her sons live in [place] and [place].    She is getting back to driving again- she had not driven in many years but knows she needs to learn again as her husband can no loner drive them  Wt Readings from Last 3 Encounters:  10/24/13 132 lb (59.875 kg)  10/11/13 133 lb 9.6 oz (60.601 kg)  02/06/11 149 lb 4.8 oz (67.722 kg)     Patient Active Problem List   Diagnosis Date Noted  . Hypertension 09/23/2013  . Hyperlipidemia 09/23/2013  . Depression 09/23/2013  . CKD (chronic kidney disease), stage III 09/23/2013  . Hyperglycemia 09/23/2013  . 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 Hydronephrosis 09/23/2013  . Melanoma of skin, site unspecified 09/23/2013    Past Medical History  Diagnosis Date  . Hypertension    . Hyperlipidemia   . Depression   . Chronic kidney disease     stage 3  . Diabetes mellitus without complication     pre diabetic    Past Surgical History  Procedure Laterality Date  . Appendectomy    . Tonsillectomy Bilateral     History  Substance Use Topics  . Smoking status: Never Smoker   . Smokeless tobacco: Never Used  . Alcohol Use: No    History reviewed. No pertinent family history.  Allergies  Allergen Reactions  . Benicar [Olmesartan]   . Celebrex [Celecoxib]   . Crestor [Rosuvastatin]   . Fosamax [Alendronate Sodium]     Medication list has been reviewed and updated.  Current Outpatient Prescriptions on File Prior to Visit  Medication Sig Dispense Refill  . escitalopram (LEXAPRO) 20 MG tablet Take 20 mg by mouth at bedtime.      Marland Kitchen escitalopram (LEXAPRO) 20 MG tablet Take 1 tablet (20 mg total) by mouth daily.  30 tablet  3  . losartan-hydrochlorothiazide (HYZAAR) 100-25 MG per tablet Take 1 tablet by mouth daily.       . pravastatin (PRAVACHOL) 40 MG tablet 40 mg daily.       . traZODone (DESYREL) 50 MG  tablet 25 mg at bedtime.        No current facility-administered medications on file prior to visit.    Review of Systems:  As per HPI- otherwise negative.   Physical Examination: Filed Vitals:   10/24/13 1141  BP: 132/62  Pulse: 72  Temp: 97.8 F (36.6 C)  Resp: 16   Filed Vitals:   10/24/13 1141  Height: 5\' 1"  (1.549 m)  Weight: 132 lb (59.875 kg)   Body mass index is 24.95 kg/(m^2). Ideal Body Weight: Weight in (lb) to have BMI = 25: 132  GEN: WDWN, NAD, Non-toxic, A & O x 3, at times tearful.  Accompanied by a friend today HEENT: Atraumatic, Normocephalic. Neck supple. No masses, No LAD. Ears and Nose: No external deformity. CV: RRR, No M/G/R. No JVD. No thrill. No extra heart sounds. PULM: CTA B, no wheezes, crackles, rhonchi. No retractions. No resp. distress. No accessory muscle use. ABD: S, NT, ND, +BS. No rebound. No HSM.   Benign exam.   EXTR: No c/c/e NEURO Normal gait.  PSYCH: Normally interactive. Conversant.    Assessment and Plan: GAD (generalized anxiety disorder) - Plan: ALPRAZolam (XANAX) 0.25 MG tablet  Immunization due - Plan: Flu Vaccine QUAD 36+ mos IM  Flu shot today.   She plans to see her new PCP in about a month.  Until then will add some xanax to her regimen.  Continue lexapro in the meantime.  She will not combine xanax with the trazodone that she takes at bedtime.    See patient instructions for more details.     Signed Lamar Blinks, MD

## 2013-10-24 NOTE — Patient Instructions (Addendum)
We are going to add some alprazolam as needed for anxiety- remember this can make you feel a bit sleepy, so do not use it when you are driving.    Please cal the 211 number and inquire about any resources that may be helpful for you as far as transportation,respite care,  etc.  You might also be able to take driver's ed to work on your driving skills again.    Please let me know how you are doing- especially if you are not getting better.  You might want to change your lexapro for paxil at a later date

## 2013-11-21 ENCOUNTER — Ambulatory Visit (INDEPENDENT_AMBULATORY_CARE_PROVIDER_SITE_OTHER): Payer: Medicare Other | Admitting: Family Medicine

## 2013-11-21 ENCOUNTER — Encounter: Payer: Self-pay | Admitting: Family Medicine

## 2013-11-21 VITALS — BP 150/72 | HR 80 | Temp 98.4°F | Wt 132.0 lb

## 2013-11-21 DIAGNOSIS — F32A Depression, unspecified: Secondary | ICD-10-CM

## 2013-11-21 DIAGNOSIS — M1990 Unspecified osteoarthritis, unspecified site: Secondary | ICD-10-CM | POA: Insufficient documentation

## 2013-11-21 DIAGNOSIS — R519 Headache, unspecified: Secondary | ICD-10-CM | POA: Insufficient documentation

## 2013-11-21 DIAGNOSIS — R5383 Other fatigue: Secondary | ICD-10-CM

## 2013-11-21 DIAGNOSIS — F329 Major depressive disorder, single episode, unspecified: Secondary | ICD-10-CM

## 2013-11-21 DIAGNOSIS — R51 Headache: Secondary | ICD-10-CM

## 2013-11-21 DIAGNOSIS — R531 Weakness: Secondary | ICD-10-CM

## 2013-11-21 MED ORDER — ESCITALOPRAM OXALATE 20 MG PO TABS: 20.0000 mg | ORAL_TABLET | Freq: Every day | ORAL | Status: DC

## 2013-11-21 MED ORDER — TRAZODONE HCL 50 MG PO TABS: 25.0000 mg | ORAL_TABLET | Freq: Every day | ORAL | Status: DC

## 2013-11-21 MED ORDER — PRAVASTATIN SODIUM 40 MG PO TABS: 40.0000 mg | ORAL_TABLET | Freq: Every day | ORAL | Status: DC

## 2013-11-21 MED ORDER — LOSARTAN POTASSIUM-HCTZ 100-25 MG PO TABS: 1.0000 | ORAL_TABLET | Freq: Every day | ORAL | Status: DC

## 2013-11-21 NOTE — Progress Notes (Signed)
Garret Reddish, MD Phone: [phone]  Subjective:  Patient presents today to establish care as new patient with previous MD Dr. Jilda Panda. transferring care for office closer to home. Chief complaint-noted.   Depression Dealing with depression since 2004.  Changed to Lexapro 20mg  10 years ago. Was stable for several years.  Overwhelmed recently starting in [facility] home that they had lived in for 40 years and moved to an apartment. Family lives out of town -near [place] and in [place]. Husband with dementia for > 5 years and this is a great stressor. Patient has not driven in 12 years and is very nervous about driving and feels lsike she has to start all over. Worries about husband when she is away from home for church functions.   Had been seen by [facility] 10/11/2013 who suggested name brand lexapro and trial support group for dementia. Patient unable to do as she is fearful to drive. She cannot afford name brand Lexapro. She she has continued to suffer since that time and followed up 13 days later with [place] who thought she had an element of generalized anxiety and started her on Xanax. She could not tolerate Xanax due to side effects and stated this was too strong  Today, PHQ9 of 16    ROS- mild abdominal queezines at times of worsening depression. No SI or HI. Decreased appetite as a result of depression. No abdominal pain. She feels weak and fatigued with no recent worsening.  The following were reviewed and entered/updated in epic: Past Medical History  Diagnosis Date  . Hypertension   . Hyperlipidemia   . Depression   . Chronic kidney disease     stage 3  . Diabetes mellitus without complication     pre diabetic  . Arthritis   . Cancer    Patient Active Problem List   Diagnosis Date Noted  . Depression 09/23/2013    Priority: High  . Melanoma of skin, site unspecified 09/23/2013    Priority: High  . Hypertension 09/23/2013    Priority: Medium  .  Hyperlipidemia 09/23/2013    Priority: Medium  . CKD (chronic kidney disease), stage III 09/23/2013    Priority: Medium  . Hyperglycemia 09/23/2013    Priority: Medium  . Frequent headaches 11/21/2013    Priority: Low  . Arthritis 11/21/2013    Priority: Low  . 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 Appendectomy    . Tonsillectomy Bilateral     Family History  Problem Relation Age of Onset  . Breast cancer Mother     Medications- reviewed and updated Current Outpatient Prescriptions  Medication Sig Dispense Refill  . escitalopram (LEXAPRO) 20 MG tablet Take 1 tablet (20 mg total) by mouth at bedtime.  90 tablet  3  . losartan-hydrochlorothiazide (HYZAAR) 100-25 MG per tablet Take 1 tablet by mouth daily.  90 tablet  3  . pravastatin (PRAVACHOL) 40 MG tablet Take 1 tablet (40 mg total) by mouth daily.  90 tablet  3  . traZODone (DESYREL) 50 MG tablet Take 0.5 tablets (25 mg total) by mouth at bedtime.  45 tablet  3   No current facility-administered medications for this visit.    Allergies-reviewed and updated Allergies  Allergen Reactions  . Benicar [Olmesartan]   . Celebrex [Celecoxib]   . Crestor [Rosuvastatin]   . Fosamax [Alendronate Sodium]     History   Social History  .  Marital Status: Married    Spouse Name: N/A    Number of Children: N/A  . Years of Education: N/A   Social History Main Topics  . Smoking status: Never Smoker   . Smokeless tobacco: Never Used  . Alcohol Use: No  . Drug Use: No  . Sexual Activity: None   Other Topics Concern  . None   Social History Narrative   Married 1963. 2 sons. 4 grandkids. Release for both sons per patient.    Husband Mikki Santee is a patient at [facility]) has dementia      Retired Print production planner.       Hobbies: working with kids (does this at church), previously gardening and working outside.    [place]    ROS--See HPI  , otherwise full ROS was completed and negative except as noted above  Objective: BP 150/72  Pulse 80  Temp(Src) 98.4 F (36.9 C)  Wt 132 lb (59.875 kg) Gen: NAD, resting comfortably in chair, moves easily to table HEENT: Mucous membranes are moist. Oropharynx normal. Good dentition. TM normal Eye: normal lids, normal iris, pupils. No scleral icterus Neck: no thyromegaly, no lymphadenopathy CV: RRR no murmurs rubs or gallops Lungs: CTAB no crackles, wheeze, rhonchi Abdomen: soft/nontender/nondistended/normal bowel sounds. No rebound or guarding.  Ext: no edema, 2+ PT and DP pulses Skin: warm, dry, no rash Neuro: 5/5 strength upper and lower extremities, 2+ reflexes Psych: depressed mood, tearful through at least 25% of visit   Assessment/Plan:  Depression Poor control on Lexapro at max dose. Discussed augmentation with another agent versus adding therapy and patient opts therapy/counseling. She plans to visit with Richardo Priest. Once she has 2 visits with her she will return with me we will repeat Phq9 which was 16 today. If medication changes are needed would consider addition of Wellbutrin or change to venlafaxine. I think her symptoms are primarily depression and not generalized anxiety disorder. I think Remeron would also be a reasonable choice given decreased appetite and weight loss.   >50% of 50 minute office visit (12:59-1:49) was spent on counseling (in relation to depression and treatment) and coordination of care. Also spoke with son for 10 minutes who clarified guilt comes form financial situation patient got husband/patient in by taking out loans when husband was in retirement and not working. He also states that they are working hard to try to motivate mother to drive to enable her to get the help she needs (goes to therapy sessions) an they have driven with her and she certainly is a safe driver.   Refill all medications Meds ordered this encounter  Medications  .  escitalopram (LEXAPRO) 20 MG tablet    Sig: Take 1 tablet (20 mg total) by mouth at bedtime.    Dispense:  90 tablet    Refill:  3  . losartan-hydrochlorothiazide (HYZAAR) 100-25 MG per tablet    Sig: Take 1 tablet by mouth daily.    Dispense:  90 tablet    Refill:  3  . pravastatin (PRAVACHOL) 40 MG tablet    Sig: Take 1 tablet (40 mg total) by mouth daily.    Dispense:  90 tablet    Refill:  3  . traZODone (DESYREL) 50 MG tablet    Sig: Take 0.5 tablets (25 mg total) by mouth at bedtime.    Dispense:  45 tablet    Refill:  3

## 2013-11-21 NOTE — Assessment & Plan Note (Signed)
Poor control on Lexapro at max dose. Discussed augmentation with another agent versus adding therapy and patient opts therapy/counseling. She plans to visit with Richardo Priest. Once she has 2 visits with her she will return with me we will repeat Phq9 which was 16 today. If medication changes are needed would consider addition of Wellbutrin or change to venlafaxine. I think her symptoms are primarily depression and not generalized anxiety disorder. I think Remeron would also be a reasonable choice given decreased appetite and weight loss.

## 2013-11-21 NOTE — Patient Instructions (Signed)
Wonderful to meet you. I am sorry you have such a heavy load on your plate. We are here to support and help you. Thank you for sharing your heart with me today.   Continue your lexapro. i want you to add counseling to your regimen with Richardo Priest.   Please check in with me in 3-6 weeks. If needed, we can add another medicine but I am hopeful we can get you where you need to be through therapy.   You can get into see me anytime needed now.

## 2013-11-23 ENCOUNTER — Telehealth: Payer: Self-pay | Admitting: Family Medicine

## 2013-11-23 NOTE — Telephone Encounter (Signed)
Pt is coming in tomorrow  °

## 2013-11-23 NOTE — Telephone Encounter (Signed)
Pt was seen on 11-21-13. Pt would like md to call her back concerning her stomach

## 2013-11-23 NOTE — Telephone Encounter (Signed)
We discussed she needs to get in for counseling at her visit and her husband's visit. We and former physicians at [facility] think symptoms are depression related. She is welcome to come in to discuss options further.

## 2013-11-23 NOTE — Telephone Encounter (Signed)
Pt states she feels nauseous, she can't eat and very dizzy. She states she is having some problems that you know about and she would like to know if there is anything else that can be done, she has lost another pound since Monday.

## 2013-11-24 ENCOUNTER — Encounter: Payer: Self-pay | Admitting: Family Medicine

## 2013-11-24 ENCOUNTER — Ambulatory Visit (INDEPENDENT_AMBULATORY_CARE_PROVIDER_SITE_OTHER): Payer: Medicare Other | Admitting: Family Medicine

## 2013-11-24 VITALS — BP 100/62 | Temp 98.2°F | Wt 131.0 lb

## 2013-11-24 DIAGNOSIS — R110 Nausea: Secondary | ICD-10-CM

## 2013-11-24 DIAGNOSIS — F411 Generalized anxiety disorder: Secondary | ICD-10-CM | POA: Insufficient documentation

## 2013-11-24 MED ORDER — ONDANSETRON 4 MG PO TBDP: 4.0000 mg | ORAL_TABLET | Freq: Three times a day (TID) | ORAL | Status: DC | PRN

## 2013-11-24 NOTE — Progress Notes (Signed)
Garret Reddish, MD Phone: [phone]  Subjective:   Lori Sharp is a 73 y.o. year old very pleasant female patient who presents with the following:  Depression/Anxiety -Queezines in abdominal area x at least 3 months ago. Onset started as stressors increased/as depression/anxiety worsened.  Trouble eating. No pain. Nauseous with eating. Seems to be worse in the morning after not eating. Feels weak. Is shaky. No worsening of symptoms but no improvement since it started.   Plan from son: work on anxiety/depression with CBT. Then work on driving with Press photographer.  Per me-later consider dementia support group.   Stressors/potential solutions Made list of things that make home more "homey" and more relaxing Driving is a great stressor. Plan for driving instructor.  Privately discussed with son-stress financially-worried that she will not be able to afford things when husband no longer living even with thoughts of "being out on the street"  ROS- No SI/HI  Past Medical History- Patient Active Problem List   Diagnosis Date Noted  . Depression 09/23/2013    Priority: High  . Melanoma of skin, site unspecified 09/23/2013    Priority: High  . Generalized anxiety disorder 11/24/2013    Priority: Medium  . Hypertension 09/23/2013    Priority: Medium  . Hyperlipidemia 09/23/2013    Priority: Medium  . CKD (chronic kidney disease), stage III 09/23/2013    Priority: Medium  . Hyperglycemia 09/23/2013    Priority: Medium  . Frequent headaches 11/21/2013    Priority: Low  . Arthritis 11/21/2013    Priority: Low  . 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 escitalopram (LEXAPRO) 20 MG tablet Take 1 tablet (20 mg total) by mouth at bedtime.  90 tablet  3  . losartan-hydrochlorothiazide (HYZAAR) 100-25 MG per tablet Take 1 tablet by mouth daily.  90 tablet  3  .  pravastatin (PRAVACHOL) 40 MG tablet Take 1 tablet (40 mg total) by mouth daily.  90 tablet  3  . traZODone (DESYREL) 50 MG tablet Take 0.5 tablets (25 mg total) by mouth at bedtime.  45 tablet  3  . ondansetron (ZOFRAN-ODT) 4 MG disintegrating tablet Take 1 tablet (4 mg total) by mouth every 8 (eight) hours as needed for nausea or vomiting.  30 tablet  2   No current facility-administered medications for this visit.    Objective: BP 100/62  Temp(Src) 98.2 F (36.8 C)  Wt 131 lb (59.421 kg) Gen: NAD, resting comfortably in chair Psych: anxious appearing but much less tearful than last time Abd: soft/nontender/nondistended. No rebound/guarding.   Assessment/Plan:  Generalized anxiety disorder At last visit, I thought symptoms were primarily depression but I believe this is a combination of anxiety/depresion at this point. Patient worried about many things as brought up by son-finances, things not feeling like home, driving (only thing alerting anxiety at last visit), even a simple thing like showing up 1 day early for appointment today-she felt awful and like a failure for. I believe CBT greatly needed for anxiety and depression.   Add zofran for nausea associated with anxiety/depression. Discussed workup with labs but family/patient want to focus on treating depression/GAD first. Return precautions advised.   Meds ordered this encounter  Medications  . ondansetron (ZOFRAN-ODT) 4 MG disintegrating tablet    Sig: Take 1 tablet (4 mg total) by mouth every 8 (eight) hours as needed for nausea or vomiting.  Dispense:  30 tablet    Refill:  2   We also discussed advanced directives per day. Patient would like to be full code at this point which I think is reasonable given health very good for 73 overall.   >50% of 30 minute office (3:50-4:20) visit was spent on counseling (about anxiety/depression/advanced directives) and coordination of care

## 2013-11-24 NOTE — Assessment & Plan Note (Signed)
At last visit, I thought symptoms were primarily depression but I believe this is a combination of anxiety/depresion at this point. Patient worried about many things as brought up by son-finances, things not feeling like home, driving (only thing alerting anxiety at last visit), even a simple thing like showing up 1 day early for appointment today-she felt awful and like a failure for. I believe CBT greatly needed for anxiety and depression.

## 2013-11-24 NOTE — Patient Instructions (Signed)
I think the first step is counseling which you have already scheduled.   I agree to open future doors and for your independence building your confidence in driving is essential.   In the short run, you can try zofran/ondansetron as needed to help with your nausea.   Its ok to take advil as needed but do not take on an empty stomach.

## 2013-11-25 ENCOUNTER — Ambulatory Visit: Payer: Medicare Other | Admitting: Family Medicine

## 2013-11-28 LAB — HM MAMMOGRAPHY: HM MAMMO: NORMAL

## 2013-11-30 ENCOUNTER — Encounter: Payer: Self-pay | Admitting: Family Medicine

## 2013-12-01 ENCOUNTER — Ambulatory Visit (INDEPENDENT_AMBULATORY_CARE_PROVIDER_SITE_OTHER): Payer: 59 | Admitting: Licensed Clinical Social Worker

## 2013-12-01 ENCOUNTER — Encounter: Payer: Self-pay | Admitting: Family Medicine

## 2013-12-01 ENCOUNTER — Ambulatory Visit (INDEPENDENT_AMBULATORY_CARE_PROVIDER_SITE_OTHER): Payer: Medicare Other | Admitting: Family Medicine

## 2013-12-01 VITALS — BP 122/80 | Temp 98.4°F | Wt 131.0 lb

## 2013-12-01 DIAGNOSIS — K625 Hemorrhage of anus and rectum: Secondary | ICD-10-CM

## 2013-12-01 DIAGNOSIS — F4322 Adjustment disorder with anxiety: Secondary | ICD-10-CM

## 2013-12-01 DIAGNOSIS — K5909 Other constipation: Secondary | ICD-10-CM

## 2013-12-01 LAB — CBC
HCT: 39 % (ref 36.0–46.0)
Hemoglobin: 12.8 g/dL (ref 12.0–15.0)
MCHC: 32.9 g/dL (ref 30.0–36.0)
MCV: 87.2 fl (ref 78.0–100.0)
Platelets: 247 10*3/uL (ref 150.0–400.0)
RBC: 4.47 Mil/uL (ref 3.87–5.11)
RDW: 13.2 % (ref 11.5–15.5)
WBC: 10.8 10*3/uL — ABNORMAL HIGH (ref 4.0–10.5)

## 2013-12-01 NOTE — Patient Instructions (Signed)
I think you had some blood from a hemorrhoid that ruptured open. BUT you also had blood on the inside of your rectum.   Let's check your blood counts to make sure you have not had a major decrease in hemoglobin. I know your preference is to not do a colonoscopy. We looked on the inside and did not see any obvious causes for bleeding near your opening. If you have further bleeding, please come back and see [place] or seek care (can call after hours line for advice).   I think the big thing is to work on your constipation. The starting medicines for this are over the counter. Take colace 100mg  twice a day to soften stool and Mirlax 17g (1 capful) daily through the weekend. If you do not have a stool, see me back on Monday.

## 2013-12-01 NOTE — Progress Notes (Signed)
Garret Reddish, MD Phone: [phone]  Subjective:   Lori Sharp is a 73 y.o. year old very pleasant female patient who presents with the following:  Constipation/Bright red blood per rectum with straining Had loose stools or normal stools until 3 days ago and then became constipated. No worsening of abdominal pain unless she tried to go to the bathroom. Tried to push out the stool. Still has not stooled other than a few pellets that were very hard. This morning had blood but no stool come out and it was all over her hands when she wiped. Never had blood in stool before. No blood since this morning. No changes in medicine other than zofran-stomach has been better even though not taking regularly. Has been indoors because of rain. Not on daily aspirin.  ROS- No melena before this time.   Past Medical History- Patient Active Problem List   Diagnosis Date Noted  . Depression 09/23/2013    Priority: High  . Melanoma of skin, site unspecified 09/23/2013    Priority: High  . Generalized anxiety disorder 11/24/2013    Priority: Medium  . Hypertension 09/23/2013    Priority: Medium  . Hyperlipidemia 09/23/2013    Priority: Medium  . CKD (chronic kidney disease), stage III 09/23/2013    Priority: Medium  . Hyperglycemia 09/23/2013    Priority: Medium  . Frequent headaches 11/21/2013    Priority: Low  . Arthritis 11/21/2013    Priority: Low  . 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 escitalopram (LEXAPRO) 20 MG tablet Take 1 tablet (20 mg total) by mouth at bedtime. 90 tablet 3  . losartan-hydrochlorothiazide (HYZAAR) 100-25 MG per tablet Take 1 tablet by mouth daily. 90 tablet 3  . ondansetron (ZOFRAN-ODT) 4 MG disintegrating tablet Take 1 tablet (4 mg total) by mouth every 8 (eight) hours as needed for nausea or vomiting. 30 tablet 2  . pravastatin (PRAVACHOL) 40  MG tablet Take 1 tablet (40 mg total) by mouth daily. 90 tablet 3  . traZODone (DESYREL) 50 MG tablet Take 0.5 tablets (25 mg total) by mouth at bedtime. 45 tablet 3  . potassium chloride (K-DUR) 10 MEQ tablet      No current facility-administered medications for this visit.    Objective: BP 122/80 mmHg  Temp(Src) 98.4 F (36.9 C) (Oral)  Wt 131 lb (59.421 kg) Gen: NAD, resting comfortably, well appearing CV: RRR no murmurs rubs or gallops Lungs: CTAB no crackles, wheeze, rhonchi Abdomen: soft/nontender/nondistended/normal bowel sounds. No rebound or guarding.  Rectal: large hemorrhoid tag at 12 o clock. Firm stool burden noted on internal exam but unable to disimpact.  Anoscopy performed without obvious bleeding or causes of bleeding. No fissures. FOBT +.  Skin: warm, dry, no rash Neuro: grossly normal, moves all extremities   Results for orders placed or performed in visit on 12/01/13 (from the past 24 hour(s))  CBC     Status: Abnormal   Collection Time: 12/01/13  3:10 PM  Result Value Ref Range   WBC 10.8 (H) 4.0 - 10.5 K/uL   RBC 4.47 3.87 - 5.11 Mil/uL   Platelets 247.0 150.0 - 400.0 K/uL   Hemoglobin 12.8 12.0 - 15.0 g/dL   HCT 39.0 36.0 - 46.0 %   MCV 87.2 78.0 - 100.0 fl   MCHC 32.9 30.0 - 36.0 g/dL   RDW 13.2 11.5 - 15.5 %  Assessment/Plan:  Bright red blood per rectum I suspect bleeding wasrelated to the external hemorrhoid  with leftover hemorrhoid tag. she has been straining and the hemorrhoid could've been caused by straining/constipation. Patient was fecal occult blood test positive.her hemoglobin was largely stable today around 13. I have had patient go home and start on a bowel regimen including Colace and Miralax per AVS. We will follow up on Monday if she has not had a bowel movement.sooner if nausea or vomiting. She was very constipated and I could feel stool board and on exam today. Unfortunately I was unable to disimpact her. patient is very anxious and  strongly requests we did not consider colonoscopy. I told her we would just need to continue to follow at this point but it would need to be a consideration in the future.  A diverticular bleed would also be a possibility suspect this would continue to bleed.  Return precautions advised.

## 2013-12-01 NOTE — Assessment & Plan Note (Signed)
I suspect bleeding wasrelated to the external hemorrhoid  with leftover hemorrhoid tag. she has been straining and the hemorrhoid could've been caused by straining/constipation. Patient was fecal occult blood test positive.her hemoglobin was largely stable today around 13. I have had patient go home and start on a bowel regimen including Colace and Miralax per AVS. We will follow up on Monday if she has not had a bowel movement.sooner if nausea or vomiting. She was very constipated and I could feel stool board and on exam today. Unfortunately I was unable to disimpact her. patient is very anxious and strongly requests we did not consider colonoscopy. I told her we would just need to continue to follow at this point but it would need to be a consideration in the future.

## 2013-12-01 NOTE — Progress Notes (Signed)
Pre visit review using our clinic review tool, if applicable. No additional management support is needed unless otherwise documented below in the visit note. 

## 2013-12-09 ENCOUNTER — Telehealth: Payer: Self-pay | Admitting: Family Medicine

## 2013-12-09 NOTE — Telephone Encounter (Addendum)
Pt son Lori Sharp is calling to let md know that his mom meet with susan bond once and per pt friend pt does not want to continue the sessions due to she feels like its not working. Pt has anxiety etc. Pt hus will see dr hunter next month. Pt son would like nurse to call him back

## 2013-12-09 NOTE — Telephone Encounter (Signed)
Pt son Gerald Stabs states that pt feels Manuela Schwartz is not helping with her b/c all she did was "talk to her for 2 hours" and he states he thinks his mom is planning on not going to these sessions anymore even though she has only gone to one session. Gerald Stabs states his mom is not aware that he is sharing this info, she has informed friends of this as well so pt son wanted you to be aware. Gerald Stabs can be reached at anytime if you need to speak with him more in depth.

## 2013-12-09 NOTE — Telephone Encounter (Signed)
Keba please call on Sat/Monday and say  "Dr. Yong Channel wanted to check on you and see how the sessions were going with Richardo Priest. He considers the sessions to be very very important for your long term goals and wanted to make sure you were able to get to the appointments and see how they were going"

## 2013-12-10 NOTE — Telephone Encounter (Signed)
Pt states she has an appointment with Manuela Schwartz Thursday and she says a friend is taking her. I advised her to make sure she keeps and goes to this appointment because it is very beneficial to her. She agrees.

## 2013-12-15 ENCOUNTER — Ambulatory Visit (INDEPENDENT_AMBULATORY_CARE_PROVIDER_SITE_OTHER): Payer: 59 | Admitting: Licensed Clinical Social Worker

## 2013-12-15 DIAGNOSIS — F4323 Adjustment disorder with mixed anxiety and depressed mood: Secondary | ICD-10-CM

## 2013-12-26 ENCOUNTER — Ambulatory Visit (INDEPENDENT_AMBULATORY_CARE_PROVIDER_SITE_OTHER): Payer: Medicare Other | Admitting: Family Medicine

## 2013-12-26 ENCOUNTER — Encounter: Payer: Self-pay | Admitting: Family Medicine

## 2013-12-26 VITALS — BP 128/62 | Temp 98.1°F | Wt 127.0 lb

## 2013-12-26 DIAGNOSIS — F32A Depression, unspecified: Secondary | ICD-10-CM

## 2013-12-26 DIAGNOSIS — F329 Major depressive disorder, single episode, unspecified: Secondary | ICD-10-CM

## 2013-12-26 MED ORDER — SERTRALINE HCL 50 MG PO TABS: 50.0000 mg | ORAL_TABLET | Freq: Every day | ORAL | Status: DC

## 2013-12-26 NOTE — Assessment & Plan Note (Addendum)
PHQ9 of 15 (from 16) and GAD 7 of 11 on max dose lexapro and CBT. Patient would like to see more progress. She has never been on any medication other than Lexapro. We opted to change to Zoloft which seems to work for her husband. She will start with 10 mg of Lexapro and 25 mg of Zoloft for 1 week. She will then stop the Lexapro and increase to 50 mg of Zoloft. She will see me in 2 weeks with plan to likely increase to 100 mg. Depending on patients perception of anxiety vs. Depression-consider augmentation with buspirone or bupropion vs. Switch to venlafaxine if not at least phq9 <10 in 6 weeks.

## 2013-12-26 NOTE — Patient Instructions (Signed)
Take 1/2 of the lexapro (10mg ) and 25mg  of zoloft (1/2 tab) for 1 week, then increase to full tab of zoloft and stop lexapro. See me in 2 weeks and we may increase to full zoloft tab.   Continue in therapy with Richardo Priest

## 2013-12-26 NOTE — Progress Notes (Signed)
Garret Reddish, MD Phone: [phone]  Subjective:   Oliver Pila Manny is a 73 y.o. year old very pleasant female patient who presents with the following:  Depression/Anxiety Patient has been working with a counselor, Richardo Priest, through behavioral health with 2 visits. . She continues on her lexapro 20mg  and trazodone for sleep.   Caring for husband still-providing for all .  Has dementia support group at home-2 women come over from church.   Continuing to cry all day.  Talking to friends and pastor at church.  New stressor- cost of having someone come into home to help with all his nursing needs  PHQ 9: 15 GAD 7 total: 11 1. Feeling nervous, anxious or on edge: 3 2. Not being able to stop or control worrying: 3 3. Worrying too much about different things:3 4. Trouble relaxing:1 5. Being so restless that it is hard to sit still:-0 6. Becoming easily annoyed or irritable: 1 7. Feeling afraid as if something awful might happen:0  ROS- No SI/HI. Queezines in abdominal area x at least 4 months ago much improved with zofran once in the morning.   Past Medical History- Patient Active Problem List   Diagnosis Date Noted  . Depression 09/23/2013    Priority: High  . Melanoma of skin, site unspecified 09/23/2013    Priority: High  . Generalized anxiety disorder 11/24/2013    Priority: Medium  . Hypertension 09/23/2013    Priority: Medium  . Hyperlipidemia 09/23/2013    Priority: Medium  . CKD (chronic kidney disease), stage III 09/23/2013    Priority: Medium  . Hyperglycemia 09/23/2013    Priority: Medium  . Frequent headaches 11/21/2013    Priority: Low  . Arthritis 11/21/2013    Priority: Low  . 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 Bright red blood per rectum 12/01/2013   Medications- reviewed and updated Current Outpatient Prescriptions  Medication Sig Dispense Refill  . escitalopram (LEXAPRO) 20 MG tablet Take 1 tablet (20 mg total) by mouth at  bedtime. 90 tablet 3  . losartan-hydrochlorothiazide (HYZAAR) 100-25 MG per tablet Take 1 tablet by mouth daily. 90 tablet 3  . ondansetron (ZOFRAN-ODT) 4 MG disintegrating tablet Take 1 tablet (4 mg total) by mouth every 8 (eight) hours as needed for nausea or vomiting. 30 tablet 2  . potassium chloride (K-DUR) 10 MEQ tablet     . pravastatin (PRAVACHOL) 40 MG tablet Take 1 tablet (40 mg total) by mouth daily. 90 tablet 3  . traZODone (DESYREL) 50 MG tablet Take 0.5 tablets (25 mg total) by mouth at bedtime. 45 tablet 3   No current facility-administered medications for this visit.    Objective: BP 128/62 mmHg  Temp(Src) 98.1 F (36.7 C)  Wt 127 lb (57.607 kg) Gen: NAD, resting comfortably in chair, intermittently tearful and anxious appearing  Assessment/Plan:  Depression PHQ9 of 15 (from 16) and GAD 7 of 11 on max dose lexapro and CBT. Patient would like to see more progress. She has never been on any medication other than Lexapro. We opted to change to Zoloft which seems to work for her husband. She will start with 10 mg of Lexapro and 25 mg of Zoloft for 1 week. She will then stop the Lexapro and increase to 50 mg of Zoloft. She will see me in 2 weeks with plan to likely increase to 100 mg. Depending on patients perception of anxiety vs. Depression-consider augmentation with buspirone or bupropion vs. Switch to  venlafaxine if not at least phq9 <10 in 6 weeks.   continue zofran for nausea associated with anxiety/depression. Discussed workup with labs but patient want to focus on treating depression/GAD first. Return precautions advised.   Meds ordered this encounter  Medications  . sertraline (ZOLOFT) 50 MG tablet    Sig: Take 1 tablet (50 mg total) by mouth daily.    Dispense:  30 tablet    Refill:  1   >50% of 30 minute office (9:20-9:40) visit was spent on counseling (about anxiety/depression) and coordination of care

## 2014-01-05 ENCOUNTER — Ambulatory Visit (INDEPENDENT_AMBULATORY_CARE_PROVIDER_SITE_OTHER): Payer: 59 | Admitting: Licensed Clinical Social Worker

## 2014-01-05 DIAGNOSIS — F4322 Adjustment disorder with anxiety: Secondary | ICD-10-CM

## 2014-01-09 ENCOUNTER — Encounter: Payer: Self-pay | Admitting: Family Medicine

## 2014-01-09 ENCOUNTER — Ambulatory Visit (INDEPENDENT_AMBULATORY_CARE_PROVIDER_SITE_OTHER): Payer: Medicare Other | Admitting: Family Medicine

## 2014-01-09 VITALS — BP 120/62 | Temp 97.9°F | Wt 124.0 lb

## 2014-01-09 DIAGNOSIS — F411 Generalized anxiety disorder: Secondary | ICD-10-CM

## 2014-01-09 DIAGNOSIS — F329 Major depressive disorder, single episode, unspecified: Secondary | ICD-10-CM

## 2014-01-09 DIAGNOSIS — F32A Depression, unspecified: Secondary | ICD-10-CM

## 2014-01-09 MED ORDER — SERTRALINE HCL 100 MG PO TABS: 100.0000 mg | ORAL_TABLET | Freq: Every day | ORAL | Status: DC

## 2014-01-09 NOTE — Assessment & Plan Note (Signed)
See depression. ANxiety/depression intermixed issues. Anxiety/worry leads to worsening depression symptoms.

## 2014-01-09 NOTE — Progress Notes (Signed)
  Garret Reddish, MD Phone: [phone]  Subjective:   Lori Sharp is a 73 y.o. year old very pleasant female patient who presents with the following:  Depression/anxiety Had a good day on Friday when meeting with Richardo Priest and seemed very optimistic. Got home and received news of husband's visit with poor nail care and need to go to podiatry and sent her backwards. She was crying all weekend. She feels like she is making little progress. Stable on zoloft 50mg .  ROS- No SI, continues to have some nausea and zofran not helping as much, admits to not eating well due to anxieyt  Past Medical History- Patient Active Problem List   Diagnosis Date Noted  . Depression 09/23/2013    Priority: High  . Melanoma of skin, site unspecified 09/23/2013    Priority: High  . Generalized anxiety disorder 11/24/2013    Priority: Medium  . Hypertension 09/23/2013    Priority: Medium  . Hyperlipidemia 09/23/2013    Priority: Medium  . CKD (chronic kidney disease), stage III 09/23/2013    Priority: Medium  . Hyperglycemia 09/23/2013    Priority: Medium  . Frequent headaches 11/21/2013    Priority: Low  . Arthritis 11/21/2013    Priority: Low  . 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 Bright red blood per rectum 12/01/2013   Medications- reviewed and updated Current Outpatient Prescriptions  Medication Sig Dispense Refill  . losartan-hydrochlorothiazide (HYZAAR) 100-25 MG per tablet Take 1 tablet by mouth daily. 90 tablet 3  . potassium chloride (K-DUR) 10 MEQ tablet     . pravastatin (PRAVACHOL) 40 MG tablet Take 1 tablet (40 mg total) by mouth daily. 90 tablet 3  . sertraline (ZOLOFT) 100 MG tablet Take 1 tablet (100 mg total) by mouth daily. 30 tablet 2  . traZODone (DESYREL) 50 MG tablet Take 0.5 tablets (25 mg total) by mouth at bedtime. 45 tablet 3  . ondansetron (ZOFRAN-ODT) 4 MG disintegrating tablet Take 1 tablet (4 mg total) by mouth every 8 (eight) hours as needed  for nausea or vomiting. (Patient not taking: Reported on 01/09/2014) 30 tablet 2   No current facility-administered medications for this visit.    Objective: BP 120/62 mmHg  Temp(Src) 97.9 F (36.6 C)  Wt 124 lb (56.246 kg) Gen: NAD, resting comfortably Psych: intermittently tearful, anxious appearing   Assessment/Plan:  Depression Did not repeat PHQ9 today but plan to in about 4 weeks. Zoloft seems to be similar at 50mg  to lexapro. Increase to 100mg  and follow up. Still considering augmenting with buspar/buspirone or switch to venlafaxine.   Generalized anxiety disorder See depression. ANxiety/depression intermixed issues. Anxiety/worry leads to worsening depression symptoms.    >50% of 30 minute office visit was spent on counseling (comforting patient as she cries, working through replacing thoughts) and coordination of care (one are of care that is stressful is dealing with husband's care-I called [facility] again for an update and spent 5 minutes on the phone doing this with several minutes to explain process to patient who did not come to husbands visit last time).   Return precautions advised. 4 week follow up.   Meds ordered this encounter  Medications  . sertraline (ZOLOFT) 100 MG tablet    Sig: Take 1 tablet (100 mg total) by mouth daily.    Dispense:  30 tablet    Refill:  2

## 2014-01-09 NOTE — Assessment & Plan Note (Signed)
Did not repeat PHQ9 today but plan to in about 4 weeks. Zoloft seems to be similar at 50mg  to lexapro. Increase to 100mg  and follow up. Still considering augmenting with buspar/buspirone or switch to venlafaxine.

## 2014-01-09 NOTE — Patient Instructions (Signed)
Increase zoloft to 100mg . See me back just after the new year. Or anytime if you just feel like you need to talk. Continue working with Richardo Priest.

## 2014-01-12 ENCOUNTER — Telehealth: Payer: Self-pay

## 2014-01-12 NOTE — Telephone Encounter (Signed)
Rx request for alprazolam 0.25 mg tablet- Take 1 tablet by mouth three times a day if needed for anxiety  Pharm:  [facility] [address]  Pls advise.

## 2014-01-12 NOTE — Telephone Encounter (Signed)
Is this ok?

## 2014-01-12 NOTE — Telephone Encounter (Signed)
From my notes she has never been on this-no refill.

## 2014-01-12 NOTE — Telephone Encounter (Signed)
denied °

## 2014-01-17 ENCOUNTER — Telehealth: Payer: Self-pay | Admitting: *Deleted

## 2014-01-17 NOTE — Telephone Encounter (Signed)
Refill xanax 0.25 mg 1 tablet tid prn for anxiety.  Last seen 01/09/14

## 2014-01-17 NOTE — Telephone Encounter (Signed)
Pt would like to know if you would give her something. Pt states her husband is being transferred to nursing home in the am and she needs something. [facility] [address]

## 2014-01-17 NOTE — Telephone Encounter (Signed)
No we are not using xanax. She needs to increase frequency of counseling to 2-3x a week with Richardo Priest.

## 2014-01-18 MED ORDER — ALPRAZOLAM 0.25 MG PO TABS: 0.2500 mg | ORAL_TABLET | Freq: Two times a day (BID) | ORAL | Status: DC | PRN

## 2014-01-18 NOTE — Telephone Encounter (Signed)
LM on pt VM TCB

## 2014-01-18 NOTE — Telephone Encounter (Signed)
Very short term course during transition given-to be faxed to pharmacy.   Please make patient aware that the plan is NOT to use this long term.

## 2014-01-18 NOTE — Telephone Encounter (Signed)
Pt states she does have an upcoming appt but she does not think she can increase her sessions with Richardo Priest at this time since she is putting her husband in a nursing home and will be back and forth to be with him.

## 2014-02-09 ENCOUNTER — Ambulatory Visit (INDEPENDENT_AMBULATORY_CARE_PROVIDER_SITE_OTHER): Payer: 59 | Admitting: Licensed Clinical Social Worker

## 2014-02-09 DIAGNOSIS — F4322 Adjustment disorder with anxiety: Secondary | ICD-10-CM

## 2014-02-13 ENCOUNTER — Ambulatory Visit (INDEPENDENT_AMBULATORY_CARE_PROVIDER_SITE_OTHER): Payer: 59 | Admitting: Family Medicine

## 2014-02-13 ENCOUNTER — Encounter: Payer: Self-pay | Admitting: Family Medicine

## 2014-02-13 VITALS — BP 106/50 | HR 78 | Temp 97.9°F | Wt 121.0 lb

## 2014-02-13 DIAGNOSIS — R634 Abnormal weight loss: Secondary | ICD-10-CM

## 2014-02-13 DIAGNOSIS — I10 Essential (primary) hypertension: Secondary | ICD-10-CM

## 2014-02-13 DIAGNOSIS — F329 Major depressive disorder, single episode, unspecified: Secondary | ICD-10-CM

## 2014-02-13 DIAGNOSIS — F32A Depression, unspecified: Secondary | ICD-10-CM

## 2014-02-13 LAB — COMPREHENSIVE METABOLIC PANEL
ALT: 12 U/L (ref 0–35)
AST: 16 U/L (ref 0–37)
Albumin: 4.2 g/dL (ref 3.5–5.2)
Alkaline Phosphatase: 58 U/L (ref 39–117)
BUN: 23 mg/dL (ref 6–23)
CHLORIDE: 100 meq/L (ref 96–112)
CO2: 30 meq/L (ref 19–32)
Calcium: 9.7 mg/dL (ref 8.4–10.5)
Creatinine, Ser: 1.03 mg/dL (ref 0.40–1.20)
GFR: 55.39 mL/min — ABNORMAL LOW (ref >60.00)
GLUCOSE: 95 mg/dL (ref 70–99)
Potassium: 4 mEq/L (ref 3.5–5.1)
Sodium: 138 mEq/L (ref 135–145)
TOTAL PROTEIN: 7.2 g/dL (ref 6.0–8.3)
Total Bilirubin: 0.5 mg/dL (ref 0.2–1.2)

## 2014-02-13 LAB — CBC
HCT: 42.3 % (ref 36.0–46.0)
HEMOGLOBIN: 13.9 g/dL (ref 12.0–15.0)
MCHC: 32.7 g/dL (ref 30.0–36.0)
MCV: 87 fl (ref 78.0–100.0)
Platelets: 297 10*3/uL (ref 150.0–400.0)
RBC: 4.87 Mil/uL (ref 3.87–5.11)
RDW: 13 % (ref 11.5–15.5)
WBC: 7.9 10*3/uL (ref 4.0–10.5)

## 2014-02-13 LAB — POCT URINALYSIS DIPSTICK
Bilirubin, UA: NEGATIVE
GLUCOSE UA: NEGATIVE
Ketones, UA: NEGATIVE
Nitrite, UA: NEGATIVE
Protein, UA: NEGATIVE
RBC UA: NEGATIVE
Spec Grav, UA: 1.02
UROBILINOGEN UA: 0.2
pH, UA: 5.5

## 2014-02-13 LAB — TSH: TSH: 1.91 u[IU]/mL (ref 0.35–4.50)

## 2014-02-13 LAB — SEDIMENTATION RATE: SED RATE: 27 mm/h — AB (ref 0–22)

## 2014-02-13 NOTE — Progress Notes (Signed)
Garret Reddish, MD Phone: [phone]  Subjective:   Lori Sharp is a 74 y.o. year old very pleasant female patient who presents with the following:  Depression/anxiety/recent loss of husband Lost her husband over the holidays. Now living alone. Symptoms unchanged when switching from lexapro to zoloft.Still having a lot of crying spells in the morning. Plans to joint widow group at church. No longer seeing Richardo Priest. PHq9 of 12 worsened from 8.  Takes xanax if nauseous and relieve symptoms most of the time. Has not been taking zofran as much as had not been as helpful.   ROS- No SI, continues to have some nausea and zofran not helping as much, admits to not eating well due to anxieyt  Weight loss-new 11 lbs lost since 11/21/13 visit. Has had lower appetite due to depression and has had some nausea. Normal mammogram 11/28/2013. Does not want to get repeat colonoscopy-strongly opposed, would be due 06/01/14-has had polyps in the past. Complains of intermittent fatigue  ROS- no blood in stool since 12/01/13, no melena, no coughing or vomiting blood, no blood in urine. Never smoker. No shortness of breath or chest pain. History of melanoma-stage III. Had December visit with Dr. Wilhemina Bonito and no concerns on skin.   Hypertension-perhaps overtreated on losartan hctz 100-39m.   BP Readings from Last 3 Encounters:  02/13/14 106/50  01/09/14 120/62  12/26/13 128/62  Dizzy with standing up but some at rest as well. No room spinning. Present for a few months.   Home BP monitoring-no Compliant with medications-dizzy as above ROS-Denies any CP, HA, SOB, blurry vision, LE edema  Past Medical History- Patient Active Problem List   Diagnosis Date Noted  . Depression 09/23/2013    Priority: High  . Melanoma of skin, site unspecified 09/23/2013    Priority: High  . Generalized anxiety disorder 11/24/2013    Priority: Medium  . Hypertension 09/23/2013    Priority: Medium  . Hyperlipidemia  09/23/2013    Priority: Medium  . CKD (chronic kidney disease), stage III 09/23/2013    Priority: Medium  . Hyperglycemia 09/23/2013    Priority: Medium  . Frequent headaches 11/21/2013    Priority: Low  . Arthritis 11/21/2013    Priority: Low  . 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 Loss of weight 02/13/2014  . Bright red blood per rectum 12/01/2013   Medications- reviewed and updated Current Outpatient Prescriptions  Medication Sig Dispense Refill  . losartan-hydrochlorothiazide (HYZAAR) 100-25 MG per tablet Take 1 tablet by mouth daily. 90 tablet 3  . potassium chloride (K-DUR) 10 MEQ tablet     . pravastatin (PRAVACHOL) 40 MG tablet Take 1 tablet (40 mg total) by mouth daily. 90 tablet 3  . sertraline (ZOLOFT) 100 MG tablet Take 1 tablet (100 mg total) by mouth daily. 30 tablet 2  . traZODone (DESYREL) 50 MG tablet Take 0.5 tablets (25 mg total) by mouth at bedtime. 45 tablet 3  . ALPRAZolam (XANAX) 0.25 MG tablet Take 1 tablet (0.25 mg total) by mouth 2 (two) times daily as needed for anxiety. (Patient not taking: Reported on 02/13/2014) 20 tablet 0  . ondansetron (ZOFRAN-ODT) 4 MG disintegrating tablet Take 1 tablet (4 mg total) by mouth every 8 (eight) hours as needed for nausea or vomiting. (Patient not taking: Reported on 01/09/2014) 30 tablet 2   Objective: BP 106/50 mmHg  Pulse 78  Temp(Src) 97.9 F (36.6 C)  Wt 121 lb (54.885 kg) Gen: NAD,  resting comfortably CV: RRR no murmurs rubs or gallops Lungs: CTAB no crackles, wheeze, rhonchi Abdomen: soft/nontender/nondistended/normal bowel sounds. No rebound or guarding.  Ext: no edema Skin: warm, dry, no rash Neuro: CN II-XII intact, sensation and reflexes normal throughout, 5/5 muscle strength in bilateral upper and lower extremities. Normal finger to nose. Normal rapid alternating movements. Normal romberg. No pronator drift.   Assessment/Plan:  Depression Still poorly controlled. PHQ9 of 12 up from 8  but did recently lose husband and grieving contributing. Continue zoloft 162m. Add CBT through behavioral health at least once a week (patient to start with widow group then try to add this in).    Hypertension Perhaps overtreated on losartan hctz 100-239m  Suspect dizziness caused by this (normal neuro exam without hearing loss or tinnitus). Cut to 1/2 dose with follow up within a few days with BP 106/50 today.    Loss of weight Mammogram up to date. Doubt lung cancer. UA without blood to suggest bladder cancer (non sensitive). Needs colonoscopy especially history BRBPR but refusing. Bloodwork today without anemia, hypothyroidism, only minimally elevated ESR. Malignancy remains a concern but mot high on differential is weight loss related to depression/grieving. Follow up within 2 weeks.     Return precautions advised. Later this week follow up for BP/dizziness. 2 week follow up for weight loss.   nonfasting Results for orders placed or performed in visit on 02/13/14 (from the past 24 hour(s))  CBC     Status: None   Collection Time: 02/13/14  9:46 AM  Result Value Ref Range   WBC 7.9 4.0 - 10.5 K/uL   RBC 4.87 3.87 - 5.11 Mil/uL   Platelets 297.0 150.0 - 400.0 K/uL   Hemoglobin 13.9 12.0 - 15.0 g/dL   HCT 42.3 36.0 - 46.0 %   MCV 87.0 78.0 - 100.0 fl   MCHC 32.7 30.0 - 36.0 g/dL   RDW 13.0 11.5 - 15.5 %  Comprehensive metabolic panel     Status: Abnormal   Collection Time: 02/13/14  9:46 AM  Result Value Ref Range   Sodium 138 135 - 145 mEq/L   Potassium 4.0 3.5 - 5.1 mEq/L   Chloride 100 96 - 112 mEq/L   CO2 30 19 - 32 mEq/L   Glucose, Bld 95 70 - 99 mg/dL   BUN 23 6 - 23 mg/dL   Creatinine, Ser 1.03 0.40 - 1.20 mg/dL   Total Bilirubin 0.5 0.2 - 1.2 mg/dL   Alkaline Phosphatase 58 39 - 117 U/L   AST 16 0 - 37 U/L   ALT 12 0 - 35 U/L   Total Protein 7.2 6.0 - 8.3 g/dL   Albumin 4.2 3.5 - 5.2 g/dL   Calcium 9.7 8.4 - 10.5 mg/dL   GFR 55.39 (L) >60.00 mL/min  TSH      Status: None   Collection Time: 02/13/14  9:46 AM  Result Value Ref Range   TSH 1.91 0.35 - 4.50 uIU/mL  Sedimentation rate     Status: Abnormal   Collection Time: 02/13/14  9:46 AM  Result Value Ref Range   Sed Rate 27 (H) 0 - 22 mm/hr  POCT urinalysis dipstick     Status: None   Collection Time: 02/13/14 10:21 AM  Result Value Ref Range   Color, UA yellow    Clarity, UA clear    Glucose, UA n    Bilirubin, UA n    Ketones, UA n    Spec Grav, UA 1.020  Blood, UA n    pH, UA 5.5    Protein, UA n    Urobilinogen, UA 0.2    Nitrite, UA n    Leukocytes, UA Trace

## 2014-02-13 NOTE — Assessment & Plan Note (Addendum)
Mammogram up to date. Doubt lung cancer. UA without blood to suggest bladder cancer (non sensitive). Needs colonoscopy especially history BRBPR but refusing. Bloodwork today without anemia, hypothyroidism, only minimally elevated ESR. Malignancy remains a concern but mot high on differential is weight loss related to depression/grieving. Follow up within 2 weeks.

## 2014-02-13 NOTE — Assessment & Plan Note (Signed)
Still poorly controlled. PHQ9 of 12 up from 8 but did recently lose husband and grieving contributing. Continue zoloft 100mg . Add CBT through behavioral health at least once a week (patient to start with widow group then try to add this in).

## 2014-02-13 NOTE — Patient Instructions (Signed)
1. Check labs for unintentional weight loss.   2. I want you to start the widow group first but I would really like for you to continue therapy given depression that remains poorly controlled. Also with loss of your husband I think regular visits would be helpful for you.   3. Blood pressure. Cut your losartan/hctz in half. See me back later in the week. Likely check in 2 weeks from then as well to check in on weight loss, continue conversation about causes. If you cannot cut it in half, let us know and may call in lower doses to trial.

## 2014-02-13 NOTE — Assessment & Plan Note (Addendum)
Perhaps overtreated on losartan hctz 100-25mg .  Suspect dizziness caused by this (normal neuro exam without hearing loss or tinnitus). Cut to 1/2 dose with follow up within a few days with BP 106/50 today.

## 2014-02-16 ENCOUNTER — Encounter: Payer: Self-pay | Admitting: Family Medicine

## 2014-02-16 ENCOUNTER — Ambulatory Visit (INDEPENDENT_AMBULATORY_CARE_PROVIDER_SITE_OTHER): Payer: Medicare Other | Admitting: Family Medicine

## 2014-02-16 VITALS — BP 112/60 | Temp 97.7°F | Wt 121.0 lb

## 2014-02-16 DIAGNOSIS — R42 Dizziness and giddiness: Secondary | ICD-10-CM

## 2014-02-16 DIAGNOSIS — I10 Essential (primary) hypertension: Secondary | ICD-10-CM

## 2014-02-16 MED ORDER — LOSARTAN POTASSIUM 50 MG PO TABS: 50.0000 mg | ORAL_TABLET | Freq: Every day | ORAL | Status: DC

## 2014-02-16 NOTE — Assessment & Plan Note (Signed)
losartan hctz 100-25mg  decreased to 1/2 tab with dizziness about 50% better. Further decrease losartan 50mg  due to dizziness as blood pressure appears to be able to tolerate. Follow up 1 month. Normal neurological exam last visit and still no tinnitus or hearing loss.

## 2014-02-16 NOTE — Progress Notes (Signed)
  Garret Reddish, MD Phone: [phone]  Subjective:   Lori Sharp is a 74 y.o. year old very pleasant female patient who presents with the following:  Hypertension-more appropriate control Dizziness-improved BP Readings from Last 3 Encounters:  02/16/14 112/60  02/13/14 106/50  01/09/14 120/62  Patient states her dizziness is about 50% improved with decreasing losartan-hctz to 1/2 tab. She states she has had some dizziness in the past especially when her neck arthritis bothers her.  Home BP monitoring-no Compliant with medications-yes without side effects, 1/2 tab losartan-hctz 100-25mg  ROS-Denies any CP, HA, SOB, blurry vision, LE edema.   Past Medical History- Patient Active Problem List   Diagnosis Date Noted  . Depression 09/23/2013    Priority: High  . Melanoma of skin, site unspecified 09/23/2013    Priority: High  . Generalized anxiety disorder 11/24/2013    Priority: Medium  . Hypertension 09/23/2013    Priority: Medium  . Hyperlipidemia 09/23/2013    Priority: Medium  . CKD (chronic kidney disease), stage III 09/23/2013    Priority: Medium  . Hyperglycemia 09/23/2013    Priority: Medium  . Frequent headaches 11/21/2013    Priority: Low  . Arthritis 11/21/2013    Priority: Low  . 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 Loss of weight 02/13/2014  . Bright red blood per rectum 12/01/2013   Medications- reviewed and updated Current Outpatient Prescriptions  Medication Sig Dispense Refill  . losartan-hydrochlorothiazide (HYZAAR) 100-25 MG per tablet Take 1 tablet by mouth daily. 90 tablet 3  . potassium chloride (K-DUR) 10 MEQ tablet     . pravastatin (PRAVACHOL) 40 MG tablet Take 1 tablet (40 mg total) by mouth daily. 90 tablet 3  . sertraline (ZOLOFT) 100 MG tablet Take 1 tablet (100 mg total) by mouth daily. 30 tablet 2  . traZODone (DESYREL) 50 MG tablet Take 0.5 tablets (25 mg total) by mouth at bedtime. 45 tablet 3  . ALPRAZolam  (XANAX) 0.25 MG tablet Take 1 tablet (0.25 mg total) by mouth 2 (two) times daily as needed for anxiety. (Patient not taking: Reported on 02/13/2014) 20 tablet 0  . ondansetron (ZOFRAN-ODT) 4 MG disintegrating tablet Take 1 tablet (4 mg total) by mouth every 8 (eight) hours as needed for nausea or vomiting. (Patient not taking: Reported on 02/16/2014) 30 tablet 2   Objective: BP 112/60 mmHg  Temp(Src) 97.7 F (36.5 C)  Wt 121 lb (54.885 kg) Gen: NAD, resting comfortably CV: RRR no murmurs rubs or gallops Lungs: CTAB no crackles, wheeze, rhonchi Abdomen: soft/nontender/nondistended/normal bowel sounds.  Ext: no edema Skin: warm, dry Neuro: grossly normal, moves all extremities   Assessment/Plan:  Hypertension losartan hctz 100-25mg  decreased to 1/2 tab with dizziness about 50% better. Further decrease losartan 50mg  due to dizziness as blood pressure appears to be able to tolerate. Follow up 1 month. Normal neurological exam last visit and still no tinnitus or hearing loss.     Meds ordered this encounter  Medications  . losartan (COZAAR) 50 MG tablet    Sig: Take 1 tablet (50 mg total) by mouth daily.    Dispense:  90 tablet    Refill:  3

## 2014-02-16 NOTE — Patient Instructions (Signed)
Glad your dizziness is better, we have some room to let your blood pressure go up (want to stay below 140/90). Stop the combination losartan-hctz pill and take losartan 50mg  alone which I sent in today.   Try boost or ensure-1 as a snack each day in addition to your regular meals.   Ok to take ibuprofen/aleve for arthritis in your neck sparingly  See me back in 1 month

## 2014-03-18 ENCOUNTER — Encounter: Payer: Self-pay | Admitting: Gastroenterology

## 2014-03-23 ENCOUNTER — Encounter: Payer: Self-pay | Admitting: Family Medicine

## 2014-03-23 ENCOUNTER — Ambulatory Visit (INDEPENDENT_AMBULATORY_CARE_PROVIDER_SITE_OTHER): Payer: Medicare Other | Admitting: Family Medicine

## 2014-03-23 VITALS — BP 128/62 | Temp 98.0°F | Wt 126.0 lb

## 2014-03-23 DIAGNOSIS — I10 Essential (primary) hypertension: Secondary | ICD-10-CM

## 2014-03-23 DIAGNOSIS — F411 Generalized anxiety disorder: Secondary | ICD-10-CM

## 2014-03-23 DIAGNOSIS — R634 Abnormal weight loss: Secondary | ICD-10-CM

## 2014-03-23 DIAGNOSIS — F329 Major depressive disorder, single episode, unspecified: Secondary | ICD-10-CM

## 2014-03-23 DIAGNOSIS — F32A Depression, unspecified: Secondary | ICD-10-CM

## 2014-03-23 NOTE — Assessment & Plan Note (Signed)
Patient continues to decline repeat colonoscopy.

## 2014-03-23 NOTE — Assessment & Plan Note (Signed)
controlled on Losartan 50mg  alone

## 2014-03-23 NOTE — Progress Notes (Signed)
Garret Reddish, MD Phone: [phone]  Subjective:   Lori Sharp is a 74 y.o. year old very pleasant female patient who presents with the following:  Hypertension-controlled on Losartan 50mg  alone  BP Readings from Last 3 Encounters:  03/23/14 128/62  02/16/14 112/60  02/13/14 106/50  Home BP monitoring-no Compliant with medications-yes without side effects ROS-Denies any CP, HA, SOB, blurry vision, LE edema. Occasional slight dizziness but much improved from previous. Slight cough, slight sore throat over last 2 days  Depression- much improved zoloft 100mg . phq9 of 12 previously now down to 0. Patient states within a few days of husband's funeral felt like she was finally able to breath out through some of the anxiety and loss. She is going to a Widow depression group, 2 other church groups, and pastors alternate coming to visit her in the home.   GAD- much improved Weight loss- now resolved The knots of nausea and abdominal discomfort ceased a few days after the ceremony for husband's death as well. Her weight is up 5 lbs and she has no difficulty with  Eating  ROS- No SI/HI. Sleeping well.   Past Medical History- Patient Active Problem List   Diagnosis Date Noted  . Depression 09/23/2013    Priority: High  . Melanoma of skin, site unspecified 09/23/2013    Priority: High  . Generalized anxiety disorder 11/24/2013    Priority: Medium  . Hypertension 09/23/2013    Priority: Medium  . Hyperlipidemia 09/23/2013    Priority: Medium  . CKD (chronic kidney disease), stage III 09/23/2013    Priority: Medium  . Hyperglycemia 09/23/2013    Priority: Medium  . Bright red blood per rectum 12/01/2013    Priority: Low  . Frequent headaches 11/21/2013    Priority: Low  . Arthritis 11/21/2013    Priority: Low  . 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 Loss of weight 02/13/2014   Medications- reviewed and updated Current Outpatient Prescriptions    Medication Sig Dispense Refill  . losartan (COZAAR) 50 MG tablet Take 1 tablet (50 mg total) by mouth daily. 90 tablet 3  . pravastatin (PRAVACHOL) 40 MG tablet Take 1 tablet (40 mg total) by mouth daily. 90 tablet 3  . sertraline (ZOLOFT) 100 MG tablet Take 1 tablet (100 mg total) by mouth daily. 30 tablet 2  . traZODone (DESYREL) 50 MG tablet Take 0.5 tablets (25 mg total) by mouth at bedtime. 45 tablet 3   No current facility-administered medications for this visit.   Objective: BP 128/62 mmHg  Temp(Src) 98 F (36.7 C)  Wt 126 lb (57.153 kg) Gen: NAD, resting comfortably Slight tonsilar swelling, no lymphadenopathy, erythematous turbinates, TM normal CV: RRR no murmurs rubs or gallops Lungs: CTAB no crackles, wheeze, rhonchi Abdomen: soft/nontender/nondistended/normal bowel sounds. No rebound or guarding.  Ext: no edema Skin: warm, dry, no rash  Psych: not anxious appearing, not tearful  Assessment/Plan:  Hypertension controlled on Losartan 50mg  alone    Generalized anxiety disorder Much improved control. Patient is even planning on trying driving next month which is a huge step for her.    Depression PHQ9 down to 0 after passing of husband. I think caregiver burden was a huge stressor. Continue zoloft. No longer using xanax.    Bright red blood per rectum Patient continues to decline repeat colonoscopy.    Loss of weight Weight loss now reversed with 5 lbs weight gain. Believe was anxiety related. Previous workup unrevealing and refused  repeat colonoscopy   3 month follow up. Also appears to be dealing with URI-return precautions advised.

## 2014-03-23 NOTE — Assessment & Plan Note (Addendum)
Weight loss now reversed with 5 lbs weight gain. Believe was anxiety related. Previous workup unrevealing and refused repeat colonoscopy

## 2014-03-23 NOTE — Patient Instructions (Signed)
Blood pressure looks great  Thrilled with 5 lbs weight gain! Glad the belly feels better  Thrilled your depression is doing so much better  Happy you are going to get back out and start driving  See me in 3 months unless you need me sooner

## 2014-03-23 NOTE — Assessment & Plan Note (Signed)
Much improved control. Patient is even planning on trying driving next month which is a huge step for her.

## 2014-03-23 NOTE — Assessment & Plan Note (Signed)
PHQ9 down to 0 after passing of husband. I think caregiver burden was a huge stressor. Continue zoloft. No longer using xanax.

## 2014-04-07 ENCOUNTER — Other Ambulatory Visit: Payer: Self-pay | Admitting: *Deleted

## 2014-04-07 MED ORDER — SERTRALINE HCL 100 MG PO TABS: 100.0000 mg | ORAL_TABLET | Freq: Every day | ORAL | Status: DC

## 2014-04-09 IMAGING — CR DG MANDIBLE 4+V
5 series · 5 of 5 positions shown · non-contrast
Comparison: None.

CLINICAL DATA: Left mandibular pain and swelling near the TMJ.

MANDIBLE - 4+ VIEW

[view not recorded (1 of 5)]
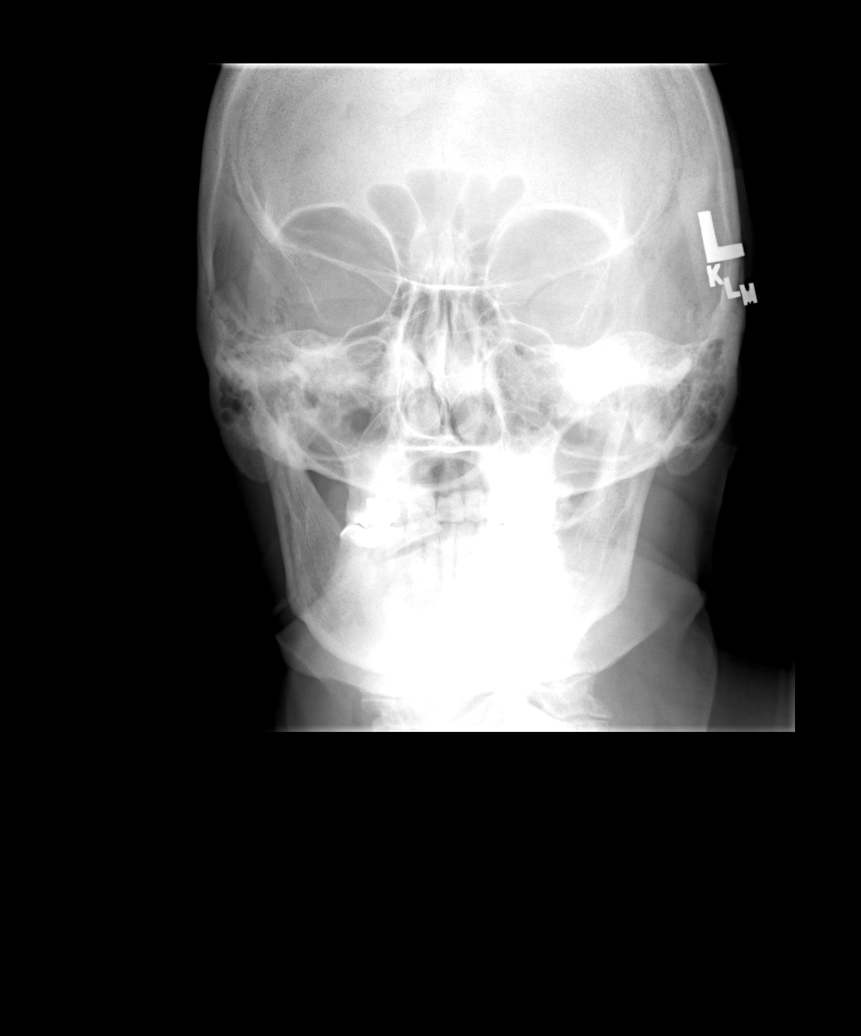

[view not recorded (2 of 5)]
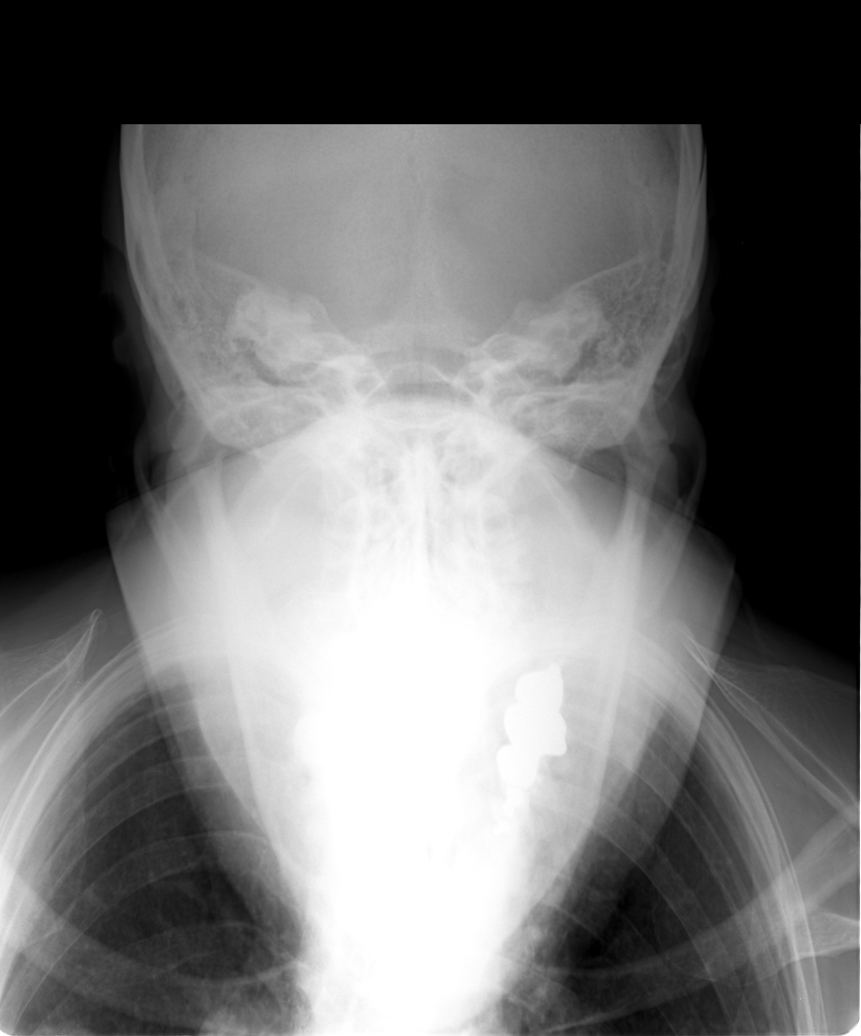

[view not recorded (3 of 5)]
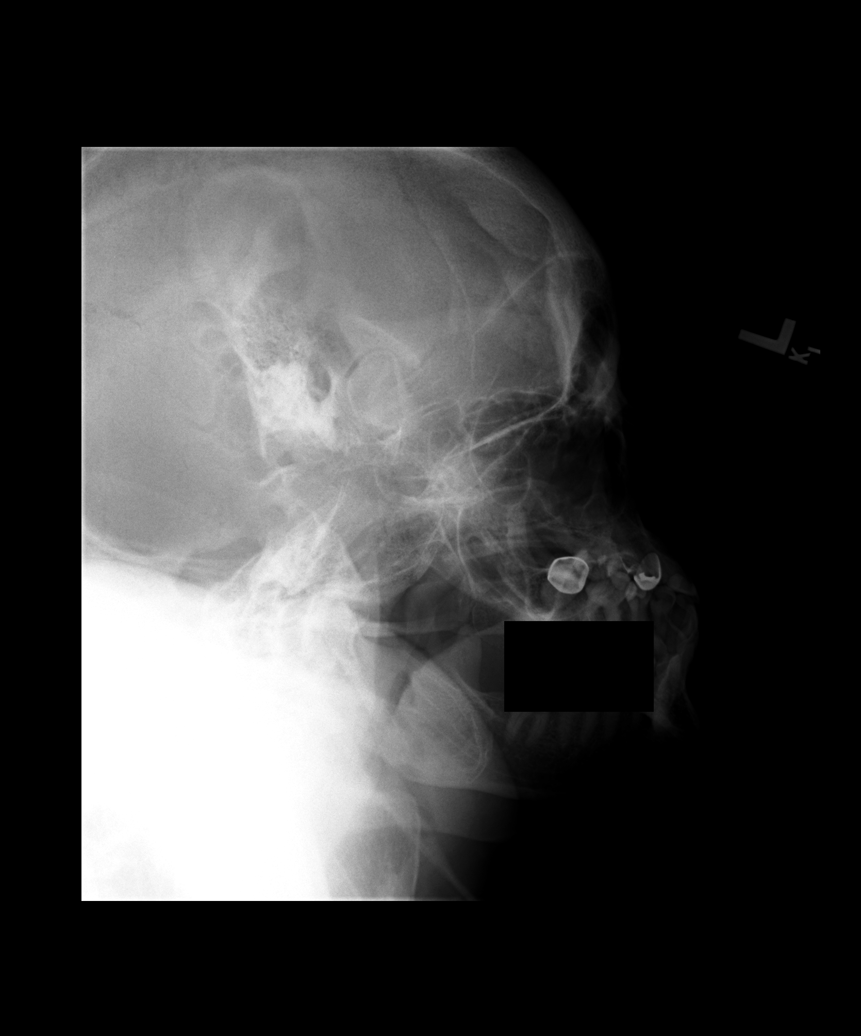

[view not recorded (4 of 5)]
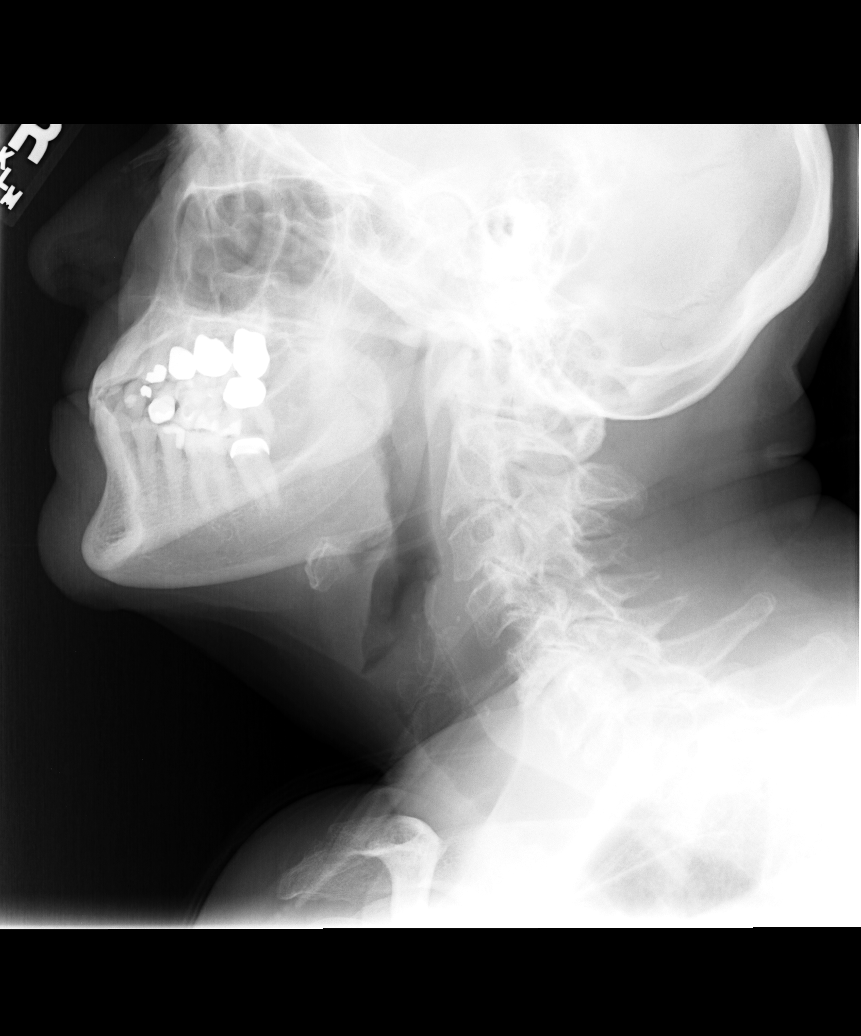

[view not recorded (5 of 5)]
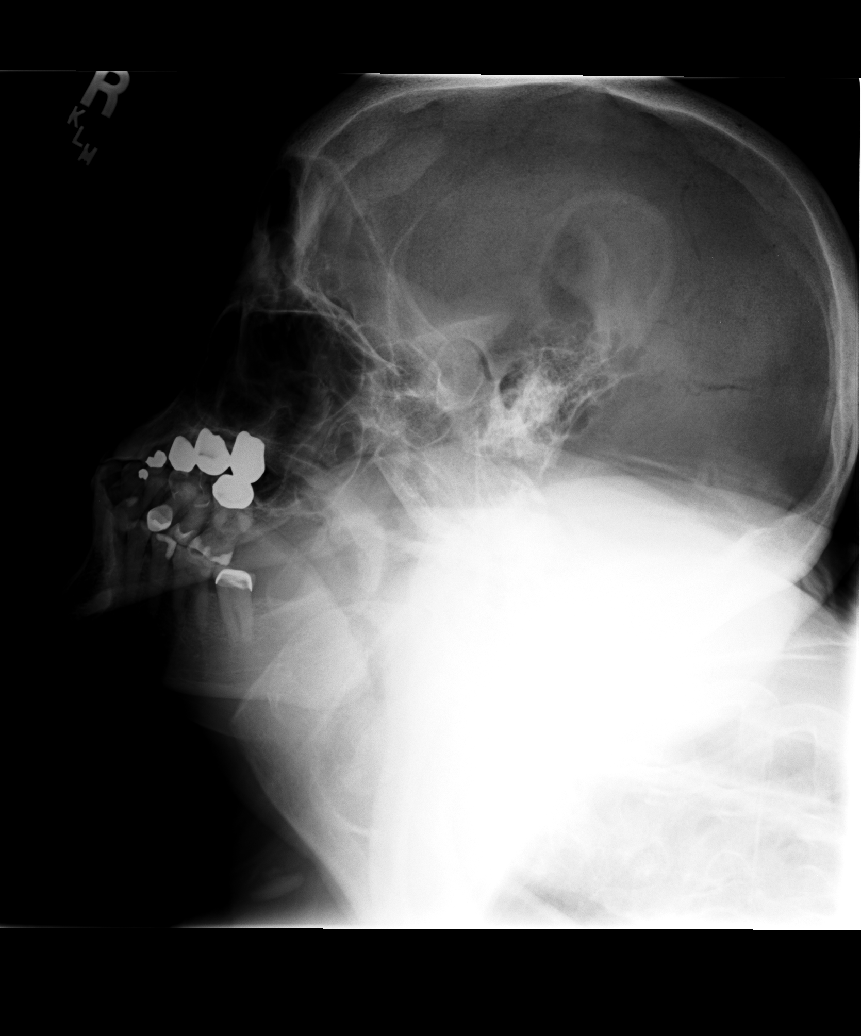

[5 of 5 positions shown; findings below may reference images not displayed]

FINDINGS: The temporomandibular joints are unremarkable bilaterally
without evidence of degenerative change, fracture or dislocation.
Imaged bones, joints and soft tissues demonstrate no acute or focal
abnormality.
IMPRESSION: Negative exam.

## 2014-06-21 ENCOUNTER — Telehealth: Payer: Self-pay | Admitting: Family Medicine

## 2014-06-21 NOTE — Telephone Encounter (Signed)
See below

## 2014-06-21 NOTE — Telephone Encounter (Signed)
Patient states sertraline (ZOLOFT) 100 MG tablet is not working and she is having a really hard time.  She was suppose to see Dr. Yong Channel tomorrow but the appointment has been changed to 07/05/14.  Patient would like to know if Dr. Yong Channel can change the medication or advise her on what to do in the mean time.  Patient would like a callback.

## 2014-06-21 NOTE — Telephone Encounter (Signed)
First, make sure she is not having any suicidal ideation.  If she is not, let's have her meet with one of our counselors next week (several sessions if needed) See me on the 8th and we will consider adjustments

## 2014-06-21 NOTE — Telephone Encounter (Signed)
Pt states that she doesn't need to see a counselor and she doesn't have a way to get to those appointments. She states she realizes her husband is gone and she is going to church and going to her groups but a counselor is not what she needs and she is not going to see anymore counselors. Pt states the first thing she does when she wakes up in the morning is cry and she will cry throughout the day for no reason. Pt states that she will just keep her appointment with Dr. Yong Channel on 06/814 and will discuss then.

## 2014-06-22 ENCOUNTER — Ambulatory Visit: Payer: Self-pay | Admitting: Family Medicine

## 2014-07-05 ENCOUNTER — Ambulatory Visit (INDEPENDENT_AMBULATORY_CARE_PROVIDER_SITE_OTHER): Payer: Medicare Other | Admitting: Family Medicine

## 2014-07-05 ENCOUNTER — Encounter: Payer: Self-pay | Admitting: Family Medicine

## 2014-07-05 VITALS — BP 140/68 | HR 64 | Temp 98.5°F | Wt 127.0 lb

## 2014-07-05 DIAGNOSIS — F329 Major depressive disorder, single episode, unspecified: Secondary | ICD-10-CM | POA: Diagnosis not present

## 2014-07-05 DIAGNOSIS — F411 Generalized anxiety disorder: Secondary | ICD-10-CM

## 2014-07-05 DIAGNOSIS — F32A Depression, unspecified: Secondary | ICD-10-CM

## 2014-07-05 DIAGNOSIS — Z23 Encounter for immunization: Secondary | ICD-10-CM

## 2014-07-05 NOTE — Assessment & Plan Note (Signed)
Reasonable control with GAD 7 of 3 on zoloft 100mg . Has focused driving anxiety issue and encouraged private driving instructor as the friend she is using tends to get anxious when she gets anxious

## 2014-07-05 NOTE — Patient Instructions (Signed)
No changes to medications  Let's push for the following activities 1. Not missing church due to concern of crying 2. Increasing driving through private driving lessions  Let's have you see Dr. Glennon Hamilton for counseling. I believe this will give you additional benefit and control of your depression.   Check in with me in 4 weeks. Would love if you had seen Dr. Glennon Hamilton at least twice between now and then

## 2014-07-05 NOTE — Assessment & Plan Note (Signed)
Symptoms greatly improved after loss of her husband (large caregiver burden). PHQ9 up today to 8 from 0, a lot related to activities she normally does slowing down for summer and her feeling lonely and noticing husband not there more. Some of this is obviously normal grieving. Counseling with Richardo Priest in past, would like to trial working with Dr. Glennon Hamilton and have referred. I would consider augmentation of therapy in future in consultation. Sons have been strongly opposed to this in past and their opinion is important to her.

## 2014-07-05 NOTE — Progress Notes (Signed)
Garret Reddish, MD  Subjective:  Lori Sharp is a 74 y.o. year old very pleasant female patient who presents with:  Depression follow up- worsened control GAD- improved control, still has focused anxiety around driving Lost husband 5 months ago. Had difficult to control depression with change from lexapro to zoloft in past per patient preference without improvement. Patient had severe anxiety at first too a lot related to caring for her husband with primary progressive aphasia. When caregiver burden resolved, anxiety and depression were much improved and ph9 plummetted from mid 10s down to 0.   Today, patient sees me almost 4 months later and states she is doing a little worse than before. Finds herself crying every morning and doesn't want to leave the house at times for fear she will cry. Missed church 3 sundays in a row which is important to her. STill going to widow support group monthly and talking to pastor monthly. Would like to trial a different counselor- has seen Richardo Priest in past but worried about driving to visit. Friend that accompanies her states it is no problem to bring her.   Patient thinks symptoms worsened when many of her activities she was doing came to an end for the summer. Patient never went through with private driving lessons.    ROS- No Si/HI, chest pain, palpitations  Past Medical History- anxiety, melanoma, hypertension, hyperlipidemia, CKD III  Medications- reviewed and updated Current Outpatient Prescriptions  Medication Sig Dispense Refill  . aspirin 81 MG tablet Take 81 mg by mouth every other day.    . losartan (COZAAR) 50 MG tablet Take 1 tablet (50 mg total) by mouth daily. 90 tablet 3  . pravastatin (PRAVACHOL) 40 MG tablet Take 1 tablet (40 mg total) by mouth daily. 90 tablet 3  . sertraline (ZOLOFT) 100 MG tablet Take 1 tablet (100 mg total) by mouth daily. 30 tablet 11  . traZODone (DESYREL) 50 MG tablet Take 0.5 tablets (25 mg total) by mouth  at bedtime. 45 tablet 3   Objective: BP 140/68 mmHg  Pulse 64  Temp(Src) 98.5 F (36.9 C)  Wt 127 lb (57.607 kg) Gen: NAD, resting comfortably Psych: not tearful as in previous visits  Assessment/Plan:  Generalized anxiety disorder Reasonable control with GAD 7 of 3 on zoloft 100mg . Has focused driving anxiety issue and encouraged private driving instructor as the friend she is using tends to get anxious when she gets anxious  Depression Symptoms greatly improved after loss of her husband (large caregiver burden). PHQ9 up today to 8 from 0, a lot related to activities she normally does slowing down for summer and her feeling lonely and noticing husband not there more. Some of this is obviously normal grieving. Counseling with Richardo Priest in past, would like to trial working with Dr. Glennon Hamilton and have referred. I would consider augmentation of therapy in future in consultation. Sons have been strongly opposed to this in past and their opinion is important to her.   4 weeks f/u. Try to see Dr. Glennon Hamilton twice if able before visit.  Goals per avs   Orders Placed This Encounter  Procedures  . Pneumococcal conjugate vaccine 13-valent   >50% of 20 minute office visit was spent on counseling (in relation to depression/grieving) and coordination of care

## 2014-07-07 ENCOUNTER — Ambulatory Visit (INDEPENDENT_AMBULATORY_CARE_PROVIDER_SITE_OTHER): Payer: 59 | Admitting: Psychology

## 2014-07-07 DIAGNOSIS — F4321 Adjustment disorder with depressed mood: Secondary | ICD-10-CM | POA: Diagnosis not present

## 2014-07-07 DIAGNOSIS — F40248 Other situational type phobia: Secondary | ICD-10-CM

## 2014-07-12 ENCOUNTER — Telehealth: Payer: Self-pay | Admitting: Family Medicine

## 2014-07-12 NOTE — Telephone Encounter (Signed)
Patient Name: Lori Sharp DOB: [birth year] Initial Comment Caller states she got a shot last week, itching, and red around the injection site. Nurse Assessment Nurse: Marcelline Deist, RN, Lynda Date/Time (Eastern Time): 07/12/2014 12:20:53 PM Confirm and document reason for call. If symptomatic, describe symptoms. ---Caller states she got a pneumonia shot last week in her left arm, has had itching, and redness around the injection site, like a rash. No fever. the area is slightly swollen, feels warm to touch. Not having pain. Has the patient traveled out of the country within the last 30 days? ---Not Applicable Does the patient require triage? ---Yes Related visit to physician within the last 2 weeks? ---Yes Does the PT have any chronic conditions? (i.e. diabetes, asthma, etc.) ---Yes List chronic conditions. ---BP rx Guidelines Guideline Title Affirmed Question Affirmed Notes Immunization Reactions Injection site reaction to any vaccine (all triage questions negative) Final Disposition [place], RN, Kermit Balo Comments Caller advised that she can use something cold on the site to help with itching/inflammation, or 1% Hydrocortisone cream. Caller states the site has not worsened, it is just itchy.

## 2014-07-12 NOTE — Telephone Encounter (Signed)
Spoke with MD Yong Channel. He advises to continue home remedies of cream and ice and plan to watch the site through the weekend. Asked patient to call back next week if site does not clear or becomes work. Called patient and patient verbalized understanding.

## 2014-07-17 ENCOUNTER — Ambulatory Visit (INDEPENDENT_AMBULATORY_CARE_PROVIDER_SITE_OTHER): Payer: 59 | Admitting: Psychology

## 2014-07-17 DIAGNOSIS — F4321 Adjustment disorder with depressed mood: Secondary | ICD-10-CM

## 2014-07-17 DIAGNOSIS — F40248 Other situational type phobia: Secondary | ICD-10-CM | POA: Diagnosis not present

## 2014-07-28 ENCOUNTER — Ambulatory Visit (INDEPENDENT_AMBULATORY_CARE_PROVIDER_SITE_OTHER): Payer: 59 | Admitting: Psychology

## 2014-07-28 DIAGNOSIS — F4321 Adjustment disorder with depressed mood: Secondary | ICD-10-CM | POA: Diagnosis not present

## 2014-08-02 ENCOUNTER — Ambulatory Visit (INDEPENDENT_AMBULATORY_CARE_PROVIDER_SITE_OTHER): Payer: Medicare Other | Admitting: Family Medicine

## 2014-08-02 ENCOUNTER — Encounter: Payer: Self-pay | Admitting: Family Medicine

## 2014-08-02 VITALS — BP 120/70 | HR 74 | Temp 98.9°F | Wt 127.0 lb

## 2014-08-02 DIAGNOSIS — F329 Major depressive disorder, single episode, unspecified: Secondary | ICD-10-CM

## 2014-08-02 DIAGNOSIS — F411 Generalized anxiety disorder: Secondary | ICD-10-CM | POA: Diagnosis not present

## 2014-08-02 DIAGNOSIS — Z1211 Encounter for screening for malignant neoplasm of colon: Secondary | ICD-10-CM

## 2014-08-02 DIAGNOSIS — Z780 Asymptomatic menopausal state: Secondary | ICD-10-CM | POA: Diagnosis not present

## 2014-08-02 DIAGNOSIS — F32A Depression, unspecified: Secondary | ICD-10-CM

## 2014-08-02 MED ORDER — BUPROPION HCL ER (XL) 150 MG PO TB24: 150.0000 mg | ORAL_TABLET | Freq: Every day | ORAL | Status: DC

## 2014-08-02 NOTE — Patient Instructions (Addendum)
[facility] will call you to schedule colonoscopy.  Depression and anxiety/nervousness with mild poor control. Let's add wellbutrin XL 150mg  and continue counseling. Contact us immediately if any thoughts of hurting yourself.  Health Maintenance Due  Topic Date Due  . ZOSTAVAX Kennith Gain vaccine 03/24/1998  . DEXA SCAN - we can order this if you would like 03/25/2003  . TETANUS/TDAP - medicare doesn't cover unless you get a cut/scrape 02/28/2014  . COLONOSCOPY - last one we have listed was 05/2014 06/01/2014   F/u 3-4 weeks  Schedule next available physical at least 3 months out

## 2014-08-02 NOTE — Assessment & Plan Note (Signed)
Mild poor control on zoloft 100mg  along with counseling with phq9 of 7. Add wellbutrin XL 150mg  and f/u 3-4 weeks.

## 2014-08-02 NOTE — Assessment & Plan Note (Signed)
Mild poor control on zoloft 100mg  along with counseling with GAD7 of 5. Add wellbutrin XL 150mg  and f/u 3-4 weeks.

## 2014-08-02 NOTE — Progress Notes (Signed)
Garret Reddish, MD  Subjective:  Lori Sharp is a 74 y.o. year old very pleasant female patient who presents with:  Depression follow up- mild poor control (PHQ9 of 7) GAD- mild poor control, still has focused anxiety around driving (GAD 7 of 5)  After loss of her husband, phq9 had temporarily gone to 0 but later reemerged with crying spells. Last visit, we added counseling with Dr. Glennon Hamilton after she had worsening control of depression after initial improvement with switch from lexapro to zoloft (per patient preference). Anxiety still remains better controlled as caring for husband was large stressor. I had discussed case with Dr. Glennon Hamilton who suggested augmentation of therapy due to continued poor control despite several sessions.   Today, patient states -patient still crying most mornings - has not missed church but does go there and cry like she normally would at home - continues going to widow support group monthly and talking to pastor monthly. - enjoying counseling sessions with Dr. Glennon Hamilton and finds them very helpful  ROS- No Si/HI, chest pain, palpitations, no longer losing weigh  Past Medical History- melanoma history, CKD III, yperglycemia, HLD, HTN  Medications- reviewed and updated Current Outpatient Prescriptions  Medication Sig Dispense Refill  . aspirin 81 MG tablet Take 81 mg by mouth every other day.    . losartan (COZAAR) 50 MG tablet Take 1 tablet (50 mg total) by mouth daily. 90 tablet 3  . pravastatin (PRAVACHOL) 40 MG tablet Take 1 tablet (40 mg total) by mouth daily. 90 tablet 3  . sertraline (ZOLOFT) 100 MG tablet Take 1 tablet (100 mg total) by mouth daily. 30 tablet 11  . traZODone (DESYREL) 50 MG tablet Take 0.5 tablets (25 mg total) by mouth at bedtime. 45 tablet 3   Objective: BP 120/70 mmHg  Pulse 74  Temp(Src) 98.9 F (37.2 C)  Wt 127 lb (57.607 kg) Gen: NAD, resting comfortably CV: RRR no murmurs rubs or gallops Lungs: CTAB no crackles, wheeze,  rhonchi Abdomen: soft/nontender/nondistended/normal bowel sounds. No rebound or guarding.  Ext: no edema Skin: warm, dry, no rash Neuro: grossly normal, moves all extremities   Assessment/Plan:  Depression Mild poor control on zoloft 100mg  along with counseling with phq9 of 7. Add wellbutrin XL 150mg  and f/u 3-4 weeks.   Generalized anxiety disorder Mild poor control on zoloft 100mg  along with counseling with GAD7 of 5. Add wellbutrin XL 150mg  and f/u 3-4 weeks.    Also see AVS.   Orders Placed This Encounter  Procedures  . DG Bone Density    Standing Status: Future     Number of Occurrences:      Standing Expiration Date: 10/03/2015    Order Specific Question:  Reason for Exam (SYMPTOM  OR DIAGNOSIS REQUIRED)    Answer:  post menopausal    Order Specific Question:  Preferred imaging location?    Answer:  [facility]  . Ambulatory referral to Gastroenterology    Referral Priority:  Routine    Referral Type:  Consultation    Referral Reason:  Specialty Services Required    Number of Visits Requested:  1    Meds ordered this encounter  Medications  . buPROPion (WELLBUTRIN XL) 150 MG 24 hr tablet    Sig: Take 1 tablet (150 mg total) by mouth daily.    Dispense:  30 tablet    Refill:  5

## 2014-08-04 ENCOUNTER — Ambulatory Visit (INDEPENDENT_AMBULATORY_CARE_PROVIDER_SITE_OTHER): Payer: 59 | Admitting: Psychology

## 2014-08-04 DIAGNOSIS — F4321 Adjustment disorder with depressed mood: Secondary | ICD-10-CM

## 2014-08-09 ENCOUNTER — Ambulatory Visit (INDEPENDENT_AMBULATORY_CARE_PROVIDER_SITE_OTHER)
Admission: RE | Admit: 2014-08-09 | Discharge: 2014-08-09 | Disposition: A | Discharge status: Home / Self Care | Payer: Medicare Other | Source: Ambulatory Visit | Attending: Family Medicine | Admitting: Family Medicine

## 2014-08-09 DIAGNOSIS — Z780 Asymptomatic menopausal state: Secondary | ICD-10-CM | POA: Diagnosis not present

## 2014-08-14 ENCOUNTER — Ambulatory Visit (INDEPENDENT_AMBULATORY_CARE_PROVIDER_SITE_OTHER): Payer: 59 | Admitting: Psychology

## 2014-08-14 ENCOUNTER — Encounter: Payer: Self-pay | Admitting: Family Medicine

## 2014-08-14 DIAGNOSIS — F4321 Adjustment disorder with depressed mood: Secondary | ICD-10-CM

## 2014-08-14 DIAGNOSIS — M8580 Other specified disorders of bone density and structure, unspecified site: Secondary | ICD-10-CM | POA: Insufficient documentation

## 2014-09-07 ENCOUNTER — Ambulatory Visit (INDEPENDENT_AMBULATORY_CARE_PROVIDER_SITE_OTHER): Payer: 59 | Admitting: Psychology

## 2014-09-07 DIAGNOSIS — F4321 Adjustment disorder with depressed mood: Secondary | ICD-10-CM | POA: Diagnosis not present

## 2014-09-14 ENCOUNTER — Ambulatory Visit (INDEPENDENT_AMBULATORY_CARE_PROVIDER_SITE_OTHER): Payer: Medicare Other | Admitting: Family Medicine

## 2014-09-14 ENCOUNTER — Encounter: Payer: Self-pay | Admitting: Family Medicine

## 2014-09-14 ENCOUNTER — Encounter: Payer: Self-pay | Admitting: Gastroenterology

## 2014-09-14 VITALS — BP 120/80 | HR 71 | Temp 98.2°F | Wt 126.0 lb

## 2014-09-14 DIAGNOSIS — I10 Essential (primary) hypertension: Secondary | ICD-10-CM | POA: Diagnosis not present

## 2014-09-14 DIAGNOSIS — F329 Major depressive disorder, single episode, unspecified: Secondary | ICD-10-CM | POA: Diagnosis not present

## 2014-09-14 DIAGNOSIS — F32A Depression, unspecified: Secondary | ICD-10-CM

## 2014-09-14 DIAGNOSIS — F411 Generalized anxiety disorder: Secondary | ICD-10-CM | POA: Diagnosis not present

## 2014-09-14 NOTE — Assessment & Plan Note (Signed)
Improved control. Continue meds-same as GAD. phq9 next visit. Continue counseling. Sorry to hear about set back with driving with car dying while driving

## 2014-09-14 NOTE — Patient Instructions (Signed)
Medication Instructions:  Same on list. But trial 1/2 tab of losartan.   Other Instructions:  Continue follow up with Dr. Augusto Gamble will check into colonoscopy  Testing/Procedures/Immunizations: Flu shot at follow up  Follow-Up (all visit scheduling, rescheduling, cancellations including labs should be scheduled at front desk): 4-6 weeks  Sooner if new or worsening symptoms

## 2014-09-14 NOTE — Progress Notes (Signed)
Lori Reddish, MD  Subjective:  Lori Sharp is a 74 y.o. year old very pleasant female patient who presents with:  Depression- improved control GAD- improved control -1 month follow up. Added wellbutrin 150mg  XL at that time. Had been compliant with zoloft. Still having crying spells but less frequent-when thinking about loss of husband. Overall feel better. Set back with anxiety about driving- Car died driving to [place]. Thinks it will set her back with driving. Dog dying unfortunatley  Hypertension-controlled, wants to know if can reduce dose  BP Readings from Last 3 Encounters:  09/14/14 120/80  08/02/14 120/70  07/05/14 140/68   ROS- No SI/HI. No chest pain or shortness of breath. Does feel just overall weak at times.  Past Medical History- hx melanoma, HLD, hyperglycemia, CKD III  Medications- reviewed and updated Current Outpatient Prescriptions  Medication Sig Dispense Refill  . aspirin 81 MG tablet Take 81 mg by mouth every other day.    Marland Kitchen buPROPion (WELLBUTRIN XL) 150 MG 24 hr tablet Take 1 tablet (150 mg total) by mouth daily. 30 tablet 5  . losartan (COZAAR) 50 MG tablet Take 1 tablet (50 mg total) by mouth daily. 90 tablet 3  . pravastatin (PRAVACHOL) 40 MG tablet Take 1 tablet (40 mg total) by mouth daily. 90 tablet 3  . sertraline (ZOLOFT) 100 MG tablet Take 1 tablet (100 mg total) by mouth daily. 30 tablet 11  . traZODone (DESYREL) 50 MG tablet Take 0.5 tablets (25 mg total) by mouth at bedtime. 45 tablet 3   Objective: BP 120/80 mmHg  Pulse 71  Temp(Src) 98.2 F (36.8 C)  Wt 126 lb (57.153 kg) Gen: NAD, resting comfortably CV: RRR no murmurs rubs or gallops Lungs: CTAB no crackles, wheeze, rhonchi Abdomen: soft/nontender/nondistended/normal bowel sounds. No rebound or guarding.  Ext: no edema Skin: warm, dry Neuro: grossly normal, moves all extremities  Assessment/Plan:  Generalized anxiety disorder Improved control. Continue counseling. GAD7 next  visit.   Depression Improved control. Continue meds-same as GAD. phq9 next visit. Continue counseling. Sorry to hear about set back with driving with car dying while driving  Hypertension Trial 25mg  losartan as remains controlled. Goal <140/90.   4-6 week f/u

## 2014-09-14 NOTE — Assessment & Plan Note (Signed)
Improved control. Continue counseling. GAD7 next visit.

## 2014-09-14 NOTE — Assessment & Plan Note (Signed)
Trial 25mg  losartan as remains controlled. Goal <140/90.

## 2014-09-20 ENCOUNTER — Ambulatory Visit (INDEPENDENT_AMBULATORY_CARE_PROVIDER_SITE_OTHER): Payer: 59 | Admitting: Psychology

## 2014-10-04 ENCOUNTER — Ambulatory Visit (INDEPENDENT_AMBULATORY_CARE_PROVIDER_SITE_OTHER): Payer: 59 | Admitting: Psychology

## 2014-10-04 DIAGNOSIS — F40248 Other situational type phobia: Secondary | ICD-10-CM | POA: Diagnosis not present

## 2014-10-04 DIAGNOSIS — F4321 Adjustment disorder with depressed mood: Secondary | ICD-10-CM | POA: Diagnosis not present

## 2014-10-12 ENCOUNTER — Ambulatory Visit (AMBULATORY_SURGERY_CENTER): Payer: Self-pay

## 2014-10-12 VITALS — Ht 61.5 in | Wt 127.0 lb

## 2014-10-12 DIAGNOSIS — Z1211 Encounter for screening for malignant neoplasm of colon: Secondary | ICD-10-CM

## 2014-10-12 NOTE — Progress Notes (Signed)
Pt came into the office today for her pre-visit prior to her colonoscopy.Pt needed to be rescheduled with Dr Fuller Plan, instead of Dr Havery Moros since she saw Dr Fuller Plan in 2004.I was unnable to get pt scheduled for her colonscopy until November and she had problems with getting a driver.Her colon was rescheduled with Dr Fuller Plan on 12/19/14 at 8:30am and a new pre-visit was scheduled on 12/05/14.Pt was very upset, but will call back if she has further questions or problems.

## 2014-10-19 ENCOUNTER — Ambulatory Visit (INDEPENDENT_AMBULATORY_CARE_PROVIDER_SITE_OTHER): Payer: Medicare Other | Admitting: Family Medicine

## 2014-10-19 ENCOUNTER — Telehealth: Payer: Self-pay | Admitting: Family Medicine

## 2014-10-19 ENCOUNTER — Encounter: Payer: Self-pay | Admitting: Family Medicine

## 2014-10-19 VITALS — BP 146/80 | HR 83 | Temp 98.3°F | Wt 127.0 lb

## 2014-10-19 DIAGNOSIS — F411 Generalized anxiety disorder: Secondary | ICD-10-CM

## 2014-10-19 DIAGNOSIS — Z23 Encounter for immunization: Secondary | ICD-10-CM

## 2014-10-19 DIAGNOSIS — F32A Depression, unspecified: Secondary | ICD-10-CM

## 2014-10-19 DIAGNOSIS — F329 Major depressive disorder, single episode, unspecified: Secondary | ICD-10-CM

## 2014-10-19 NOTE — Progress Notes (Signed)
Garret Reddish, MD  Subjective:  Lori Sharp is a 74 y.o. year old very pleasant female patient who presents for/with See problem oriented charting ROS- no suicidal or homicidal ideation. Does have crying spells. No chest pain shortness of breath.  Past Medical History-  Patient Active Problem List   Diagnosis Date Noted  . Generalized anxiety disorder 11/24/2013    Priority: High  . Depression 09/23/2013    Priority: High  . Melanoma of skin, site unspecified 09/23/2013    Priority: High  . Hypertension 09/23/2013    Priority: Medium  . Hyperlipidemia 09/23/2013    Priority: Medium  . CKD (chronic kidney disease), stage III 09/23/2013    Priority: Medium  . Hyperglycemia 09/23/2013    Priority: Medium  . Bright red blood per rectum 12/01/2013    Priority: Low  . Frequent headaches 11/21/2013    Priority: Low  . Arthritis 11/21/2013    Priority: Low  . 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 Osteopenia 08/14/2014  . Loss of weight 02/13/2014    Medications- reviewed and updated Current Outpatient Prescriptions  Medication Sig Dispense Refill  . aspirin 81 MG tablet Take 81 mg by mouth every other day.    Marland Kitchen buPROPion (WELLBUTRIN XL) 150 MG 24 hr tablet Take 1 tablet (150 mg total) by mouth daily. 30 tablet 5  . losartan (COZAAR) 50 MG tablet Take 1 tablet (50 mg total) by mouth daily. 90 tablet 3  . pravastatin (PRAVACHOL) 40 MG tablet Take 1 tablet (40 mg total) by mouth daily. 90 tablet 3  . sertraline (ZOLOFT) 100 MG tablet Take 1 tablet (100 mg total) by mouth daily. 30 tablet 11  . traZODone (DESYREL) 50 MG tablet Take 0.5 tablets (25 mg total) by mouth at bedtime. 45 tablet 3   Objective: BP 146/80 mmHg  Pulse 83  Temp(Src) 98.3 F (36.8 C)  Wt 127 lb (57.607 kg) Exam deferred  Blood pressure previously controlled we will follow BP Readings from Last 3 Encounters:  10/19/14 146/80  09/14/14 120/80  08/02/14 120/70    Assessment/Plan:  Depression Also GAD S: Despite taking Zoloft 100 mg and Wellbutrin 150 mg extended release patient has had worsening of her anxiety and depression once again. Her GAD 7 score is 11 her PHQ9 is a 6 when previously both were trending to less than 5. We previously had her on Lexapro and switched and that was not helpful. Patient is seeing Dr. Glennon Hamilton regularly of psychology and that is helpful when she has sessions but within a few day she feels worse. Originally Symptoms greatly improved after loss of her husband (large caregiver burden). I have spoken with Dr. Glennon Hamilton who believes she needs psychiatry's input at this piont.  A/P: Patient hates to burden her friends and is very hesitant about going to see psychiatry. I told her I thought this would be best for her. Other option would be to increase Zoloft to 150 mg or change to venlafaxine. If patient does not follow through with psychiatry will consider these options.   Return precautions advised.   Orders Placed This Encounter  Procedures  . Flu Vaccine QUAD 36+ mos IM   >50% of 20 minute office visit was spent on counseling (treatment options for depression, importance of psychiatry in this case)  and coordination of care

## 2014-10-19 NOTE — Telephone Encounter (Signed)
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 We have a few options left if difficult to get in.

## 2014-10-19 NOTE — Patient Instructions (Addendum)
Flu shot received today.  Refer to psychiatry. Given handout to [facility] with providers that use [place] [facility]

## 2014-10-19 NOTE — Assessment & Plan Note (Signed)
Also GAD S: Despite taking Zoloft 100 mg and Wellbutrin 150 mg extended release patient has had worsening of her anxiety and depression once again. Her GAD 7 score is 11 her PHQ9 is a 6 when previously both were trending to less than 5. We previously had her on Lexapro and switched and that was not helpful. Patient is seeing Dr. Glennon Hamilton regularly of psychology and that is helpful when she has sessions but within a few day she feels worse. Originally Symptoms greatly improved after loss of her husband (large caregiver burden). I have spoken with Dr. Glennon Hamilton who believes she needs psychiatry's input at this piont.  A/P: Patient hates to burden her friends and is very hesitant about going to see psychiatry. I told her I thought this would be best for her. Other option would be to increase Zoloft to 150 mg or change to venlafaxine. If patient does not follow through with psychiatry will consider these options.

## 2014-10-19 NOTE — Telephone Encounter (Signed)
Pt son Laurey Arrow call to ask for a call back .Would like to discuss propose treatment for his mother. He said she does not know and seems confuse

## 2014-10-19 NOTE — Telephone Encounter (Signed)
See below

## 2014-10-20 ENCOUNTER — Ambulatory Visit (INDEPENDENT_AMBULATORY_CARE_PROVIDER_SITE_OTHER): Payer: 59 | Admitting: Psychology

## 2014-10-20 DIAGNOSIS — F40248 Other situational type phobia: Secondary | ICD-10-CM | POA: Diagnosis not present

## 2014-10-20 DIAGNOSIS — F321 Major depressive disorder, single episode, moderate: Secondary | ICD-10-CM

## 2014-10-23 ENCOUNTER — Telehealth: Payer: Self-pay | Admitting: Family Medicine

## 2014-10-23 NOTE — Telephone Encounter (Addendum)
FYI pt is calling to let md know she has not cried in two days. Pt also want md to know the pyschiatry he recommend  does not accept uhc . Pt said she will stick with dr Glennon Hamilton

## 2014-10-24 NOTE — Telephone Encounter (Signed)
I would encourage her to call her insurance to see who they cover then- this is very important. She needs to follow through with psychiatry given difficult to control depression

## 2014-10-24 NOTE — Telephone Encounter (Signed)
Pt notified of your recommendation and wants to remain with Dr. Glennon Hamilton she will notify us if she chooses otherwise.

## 2014-10-24 NOTE — Telephone Encounter (Signed)
FYI

## 2014-10-30 ENCOUNTER — Telehealth: Payer: Self-pay | Admitting: Family Medicine

## 2014-10-30 NOTE — Telephone Encounter (Signed)
FYI

## 2014-10-30 NOTE — Telephone Encounter (Signed)
Pt wants you to know she is feeling better and the medicine is finally working. Pt has made  Follow up appointment on 10/21.

## 2014-11-03 ENCOUNTER — Ambulatory Visit (INDEPENDENT_AMBULATORY_CARE_PROVIDER_SITE_OTHER): Payer: 59 | Admitting: Psychology

## 2014-11-03 DIAGNOSIS — F321 Major depressive disorder, single episode, moderate: Secondary | ICD-10-CM

## 2014-11-03 DIAGNOSIS — F40248 Other situational type phobia: Secondary | ICD-10-CM | POA: Diagnosis not present

## 2014-11-08 ENCOUNTER — Other Ambulatory Visit: Payer: Self-pay | Admitting: Family Medicine

## 2014-11-09 ENCOUNTER — Encounter: Payer: Self-pay | Admitting: Gastroenterology

## 2014-11-17 ENCOUNTER — Encounter: Payer: Self-pay | Admitting: Family Medicine

## 2014-11-17 ENCOUNTER — Ambulatory Visit (INDEPENDENT_AMBULATORY_CARE_PROVIDER_SITE_OTHER): Payer: Medicare Other | Admitting: Family Medicine

## 2014-11-17 VITALS — BP 150/70 | HR 75 | Temp 98.2°F | Resp 14 | Ht 61.5 in | Wt 128.5 lb

## 2014-11-17 DIAGNOSIS — F411 Generalized anxiety disorder: Secondary | ICD-10-CM | POA: Diagnosis not present

## 2014-11-17 DIAGNOSIS — F32A Depression, unspecified: Secondary | ICD-10-CM

## 2014-11-17 DIAGNOSIS — I10 Essential (primary) hypertension: Secondary | ICD-10-CM | POA: Diagnosis not present

## 2014-11-17 DIAGNOSIS — F329 Major depressive disorder, single episode, unspecified: Secondary | ICD-10-CM

## 2014-11-17 NOTE — Progress Notes (Signed)
Pre visit review using our clinic review tool, if applicable. No additional management support is needed unless otherwise documented below in the visit note. 

## 2014-11-17 NOTE — Progress Notes (Signed)
Garret Reddish, MD  Subjective:  Lori Sharp is a 74 y.o. year old very pleasant female patient who presents for/with See problem oriented charting ROS- no SI/HI. No chest pain,shortness of breath, headache or blurry vision  Past Medical History-  Patient Active Problem List   Diagnosis Date Noted  . Generalized anxiety disorder 11/24/2013    Priority: High  . Depression 09/23/2013    Priority: High  . Melanoma of skin, site unspecified 09/23/2013    Priority: High  . Hypertension 09/23/2013    Priority: Medium  . Hyperlipidemia 09/23/2013    Priority: Medium  . CKD (chronic kidney disease), stage III 09/23/2013    Priority: Medium  . Hyperglycemia 09/23/2013    Priority: Medium  . Bright red blood per rectum 12/01/2013    Priority: Low  . Frequent headaches 11/21/2013    Priority: Low  . Arthritis 11/21/2013    Priority: Low  . 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 Osteopenia 08/14/2014  . Loss of weight 02/13/2014    Medications- reviewed and updated Current Outpatient Prescriptions  Medication Sig Dispense Refill  . aspirin 81 MG tablet Take 81 mg by mouth every other day.    Marland Kitchen buPROPion (WELLBUTRIN XL) 150 MG 24 hr tablet Take 1 tablet (150 mg total) by mouth daily. 30 tablet 5  . losartan (COZAAR) 50 MG tablet Take 1 tablet (50 mg total) by mouth daily. (Patient taking differently: Take 25 mg by mouth daily. ) 90 tablet 3  . pravastatin (PRAVACHOL) 40 MG tablet take 1 tablet by mouth once daily 90 tablet 3  . sertraline (ZOLOFT) 100 MG tablet Take 1 tablet (100 mg total) by mouth daily. 30 tablet 11  . traZODone (DESYREL) 50 MG tablet take 1/2 tablet by mouth at bedtime 45 tablet 3   No current facility-administered medications for this visit.    Objective: BP 150/70 mmHg  Pulse 75  Temp(Src) 98.2 F (36.8 C) (Oral)  Resp 14  Ht 5' 1.5" (1.562 m)  Wt 128 lb 8 oz (58.287 kg)  BMI 23.89 kg/m2 Gen: NAD, resting comfortably CV: RRR no  murmurs rubs or gallops Lungs: CTAB no crackles, wheeze, rhonchi Abdomen: soft/nontender/nondistended/normal bowel sounds. No rebound or guarding.  Ext: no edema Skin: warm, dry Neuro: grossly normal, moves all extremities  Assessment/Plan:  Hypertension S: previously titrated down losartan hctz to just 25mg  losartan. Poor control today on this BP Readings from Last 3 Encounters:  11/17/14 150/70  10/19/14 146/80  09/14/14 120/80  A/P: we will restart full pill of losartan 50mg  and patient will monitor at home   Depression GAD S: improved control of both depression and GAD on zoloft 100mg , wellbutrin 150mg  XL. Despite no changes in meds. Patient had some tough conversations with family and after that she has done better. Also changing apartments to 1st floor with more shade and better view and not the last place she and her husband lived together- she is excited about this. Driving wise, zoloft 100mg , wellbutrin 150mg  XL. A/P: continue current rx. We will space patient out to 6 month given drastic improvement but she is aware of reasons to see me sooner and will continue to see Dr. Glennon Hamilton of [facility] in meantime  6 months planned

## 2014-11-17 NOTE — Patient Instructions (Signed)
Glad depression and anxiety are doing better. I am here for you if that changes- schedule anytime you feel like things are not going in the right direction. I think the new apartment is really going to help.   Thrilled you are driving some. Love the idea of trying to get out to [employer] as next step.   Let's go back to the losartan full pill since blood pressure is up  Let me know within a few weeks to a month what your blood pressure is at home. Can call Bevelyn Ngo and have her write them down for me  If still high, may see you back or make further adjustments

## 2014-11-18 ENCOUNTER — Encounter: Payer: Self-pay | Admitting: Family Medicine

## 2014-11-18 NOTE — Assessment & Plan Note (Signed)
S: improved control of both depression and GAD on zoloft 100mg , wellbutrin 150mg  XL. Despite no changes in meds. Patient had some tough conversations with family and after that she has done better. Also changing apartments to 1st floor with more shade and better view and not the last place she and her husband lived together- she is excited about this. Driving wise, zoloft 100mg , wellbutrin 150mg  XL. A/P: continue current rx. We will space patient out to 6 month given drastic improvement but she is aware of reasons to see me sooner and will continue to see Dr. Glennon Hamilton of [facility] in meantime

## 2014-11-18 NOTE — Assessment & Plan Note (Signed)
S: previously titrated down losartan hctz to just 25mg  losartan. Poor control today on this BP Readings from Last 3 Encounters:  11/17/14 150/70  10/19/14 146/80  09/14/14 120/80  A/P: we will restart full pill of losartan 50mg  and patient will monitor at home

## 2014-11-24 ENCOUNTER — Ambulatory Visit: Payer: 59 | Admitting: Psychology

## 2014-11-27 ENCOUNTER — Telehealth: Payer: Self-pay | Admitting: Family Medicine

## 2014-11-27 NOTE — Telephone Encounter (Signed)
Pt notified and wanted me to let you know her BP this morning both times she checked were 115/58.

## 2014-11-27 NOTE — Telephone Encounter (Signed)
Coricidin HBP products are fine. Specific ingredients ok for her include dextromethorphan, guaifenesin  Could also use cough drops

## 2014-11-27 NOTE — Telephone Encounter (Signed)
See below

## 2014-11-27 NOTE — Telephone Encounter (Signed)
Pt has had a mildly productivecough x 2 weeks. No fever just fatigue. Is moving in a few days and would like to know what OTC meds she can safely take.

## 2014-11-28 ENCOUNTER — Encounter: Payer: Self-pay | Admitting: Gastroenterology

## 2014-12-05 ENCOUNTER — Ambulatory Visit (AMBULATORY_SURGERY_CENTER): Payer: Self-pay

## 2014-12-05 VITALS — Ht 61.5 in | Wt 126.6 lb

## 2014-12-05 DIAGNOSIS — Z1211 Encounter for screening for malignant neoplasm of colon: Secondary | ICD-10-CM

## 2014-12-05 MED ORDER — NA SULFATE-K SULFATE-MG SULF 17.5-3.13-1.6 GM/177ML PO SOLN: ORAL | Status: DC

## 2014-12-05 NOTE — Progress Notes (Signed)
Per pt, no allergies to soy or egg products.Pt not taking any weight loss meds or using  O2 at home. 

## 2014-12-12 ENCOUNTER — Encounter: Payer: Self-pay | Admitting: Gastroenterology

## 2014-12-14 ENCOUNTER — Encounter: Payer: Self-pay | Admitting: Family Medicine

## 2014-12-14 ENCOUNTER — Ambulatory Visit (INDEPENDENT_AMBULATORY_CARE_PROVIDER_SITE_OTHER): Payer: Medicare Other | Admitting: Family Medicine

## 2014-12-14 VITALS — BP 142/60 | HR 74 | Temp 98.5°F | Wt 128.0 lb

## 2014-12-14 DIAGNOSIS — J329 Chronic sinusitis, unspecified: Secondary | ICD-10-CM

## 2014-12-14 DIAGNOSIS — A499 Bacterial infection, unspecified: Secondary | ICD-10-CM

## 2014-12-14 DIAGNOSIS — B9689 Other specified bacterial agents as the cause of diseases classified elsewhere: Secondary | ICD-10-CM

## 2014-12-14 MED ORDER — AMOXICILLIN-POT CLAVULANATE 875-125 MG PO TABS: 1.0000 | ORAL_TABLET | Freq: Two times a day (BID) | ORAL | Status: DC

## 2014-12-14 NOTE — Patient Instructions (Signed)
Augmentin for 7 days for bacterial sinus infection- follow up if not improving by end of course of antibiotics or if new or worsening symptoms. Hopeful we can get you feeling better so you can go through with colonoscopy  Sinusitis, Adult Sinusitis is redness, soreness, and puffiness (inflammation) of the air pockets in the bones of your face (sinuses). The redness, soreness, and puffiness can cause air and mucus to get trapped in your sinuses. This can allow germs to grow and cause an infection.  HOME CARE   Drink enough fluids to keep your pee (urine) clear or pale yellow.  Use a humidifier in your home.  Run a hot shower to create steam in the bathroom. Sit in the bathroom with the door closed. Breathe in the steam 3-4 times a day.  Put a warm, moist washcloth on your face 3-4 times a day, or as told by your doctor.  Use salt water sprays (saline sprays) to wet the thick fluid in your nose. This can help the sinuses drain.  Only take medicine as told by your doctor. GET HELP RIGHT AWAY IF:   Your pain gets worse.  You have very bad headaches.  You are sick to your stomach (nauseous).  You throw up (vomit).  You are very sleepy (drowsy) all the time.  Your face is puffy (swollen).  Your vision changes.  You have a stiff neck.  You have trouble breathing. MAKE SURE YOU:   Understand these instructions.  Will watch your condition.  Will get help right away if you are not doing well or get worse.   This information is not intended to replace advice given to you by your health care provider. Make sure you discuss any questions you have with your health care provider.   Document Released: 07/02/2007 Document Revised: 02/03/2014 Document Reviewed: 08/19/2011 Elsevier Interactive Patient Education [employer].

## 2014-12-14 NOTE — Progress Notes (Signed)
  PCP: Garret Reddish, MD  Subjective:  Lori Sharp is a 74 y.o. year old very pleasant female patient who presents with sinus infection symptoms including   Sore throat, hoarseness, sinus pressure, occasional dry cough, nasal congestion.  2 weeks of symptoms. Not improving. 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 Has tried corricidin for cough  ROS-denies fever, SOB, NVD, tooth pain. Occasional chills.   Pertinent Past Medical History-  Patient Active Problem List   Diagnosis Date Noted  . Generalized anxiety disorder 11/24/2013    Priority: High  . Depression 09/23/2013    Priority: High  . Melanoma of skin, site unspecified 09/23/2013    Priority: High  . Hypertension 09/23/2013    Priority: Medium  . Hyperlipidemia 09/23/2013    Priority: Medium  . CKD (chronic kidney disease), stage III 09/23/2013    Priority: Medium  . Hyperglycemia 09/23/2013    Priority: Medium  . Bright red blood per rectum 12/01/2013    Priority: Low  . Frequent headaches 11/21/2013    Priority: Low  . Arthritis 11/21/2013    Priority: Low  . 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 Osteopenia 08/14/2014  . Loss of weight 02/13/2014    Medications- reviewed  Current Outpatient Prescriptions  Medication Sig Dispense Refill  . aspirin 81 MG tablet Take 81 mg by mouth every other day.    Marland Kitchen buPROPion (WELLBUTRIN XL) 150 MG 24 hr tablet Take 1 tablet (150 mg total) by mouth daily. 30 tablet 5  . losartan (COZAAR) 50 MG tablet Take 1 tablet (50 mg total) by mouth daily. 90 tablet 3  . pravastatin (PRAVACHOL) 40 MG tablet take 1 tablet by mouth once daily 90 tablet 3  . sertraline (ZOLOFT) 100 MG tablet Take 1 tablet (100 mg total) by mouth daily. 30 tablet 11  . traZODone (DESYREL) 50 MG tablet take 1/2 tablet by mouth at bedtime 45 tablet 3  . acetaminophen (TYLENOL) 500 MG tablet Take 500 mg by mouth as needed.    . Na Sulfate-K Sulfate-Mg Sulf (SUPREP BOWEL PREP) SOLN Suprep as  directed / no substitutions (Patient not taking: Reported on 12/14/2014) 354 mL 0  . Naproxen Sodium (ALEVE PO) Take by mouth as needed.     No current facility-administered medications for this visit.    Objective: BP 142/60 mmHg  Pulse 74  Temp(Src) 98.5 F (36.9 C)  Wt 128 lb (58.06 kg)  SpO2 98% Gen: NAD, resting comfortably HEENT: Turbinates erythematous with yellow drainage, TM normal, pharynx mildly erythematous with no tonsilar exudate or edema, frontal but no maxillary sinus tenderness CV: RRR no murmurs rubs or gallops Lungs: CTAB no crackles, wheeze, rhonchi Ext: no edema Skin: warm, dry, no rash Neuro: grossly normal, moves all extremities  Assessment/Plan:  Bacterial Sinusitis 2 weeks of symptoms. No red flags but no improvement and upcoming colonoscopy. 7 days of augmentin prescribed  Finally, we reviewed reasons to return to care including if symptoms worsen or persist or new concerns arise.  Watch BP- technically at goal per [facility]- patient with intermittent orthostatic issues so difficult to push lower BP Readings from Last 3 Encounters:  12/14/14 142/60  11/17/14 150/70  10/19/14 146/80   Meds ordered this encounter  Medications  . amoxicillin-clavulanate (AUGMENTIN) 875-125 MG tablet    Sig: Take 1 tablet by mouth 2 (two) times daily.    Dispense:  14 tablet    Refill:  0

## 2014-12-19 ENCOUNTER — Ambulatory Visit (AMBULATORY_SURGERY_CENTER): Payer: Medicare Other | Admitting: Gastroenterology

## 2014-12-19 ENCOUNTER — Encounter: Payer: Self-pay | Admitting: Gastroenterology

## 2014-12-19 VITALS — BP 160/59 | HR 72 | Temp 98.7°F | Resp 27 | Ht 61.5 in | Wt 126.0 lb

## 2014-12-19 DIAGNOSIS — Z1211 Encounter for screening for malignant neoplasm of colon: Secondary | ICD-10-CM | POA: Diagnosis not present

## 2014-12-19 MED ORDER — SODIUM CHLORIDE 0.9 % IV SOLN: 500.0000 mL | INTRAVENOUS | Status: DC

## 2014-12-19 NOTE — Op Note (Addendum)
[facility]  [address] [place], [address]   COLONOSCOPY PROCEDURE REPORT  PATIENT: Lori Sharp, Lori Sharp  MR#: [id number] BIRTHDATE: 1940-10-30 , 74  yrs. old GENDER: female ENDOSCOPIST: Ladene Artist, MD, [facility] PROCEDURE DATE:  12/19/2014 PROCEDURE:   Colonoscopy, screening First Screening Colonoscopy - Avg.  risk and is 50 yrs.  old or older - No.  Prior Negative Screening - Now for repeat screening. 10 or more years since last screening  History of Adenoma - Now for follow-up colonoscopy & has been > or = to 3 yrs.  N/A  Polyps removed today? No Recommend repeat exam, <10 yrs? No ASA CLASS:   Class II INDICATIONS:Screening for colonic neoplasia and Colorectal Neoplasm Risk Assessment for this procedure is average risk. MEDICATIONS: Monitored anesthesia care and Propofol 150 mg IV DESCRIPTION OF PROCEDURE:   After the risks benefits and alternatives of the procedure were thoroughly explained, informed consent was obtained.  The digital rectal exam revealed no abnormalities of the rectum.   The LB [id number]  endoscope was introduced through the anus and advanced to the cecum, which was identified by both the appendix and ileocecal valve. No adverse events experienced with a tortuous colon.   The quality of the prep was excellent.  (Suprep was used)  The instrument was then slowly withdrawn as the colon was fully examined. Estimated blood loss is zero unless otherwise noted in this procedure report.    COLON FINDINGS: A normal appearing cecum, ileocecal valve, and appendiceal orifice were identified.  The ascending, transverse, descending, sigmoid colon, and rectum appeared unremarkable. Retroflexed views revealed internal Grade I hemorrhoids. The time to cecum = 4.4 Withdrawal time = 8.1   The scope was withdrawn and the procedure completed. COMPLICATIONS: There were no immediate complications.  ENDOSCOPIC IMPRESSION: 1.   Normal colonoscopy 2.    Grade I internal hemorrhoids  RECOMMENDATIONS: Given your age, you will not need another colonoscopy for colon cancer screening or polyp surveillance.  These types of tests usually stop around the age 76.  eSigned:  Ladene Artist, MD, [facility] 12/19/2014 10:13 AM Revised: 12/19/2014 10:13 AM

## 2014-12-19 NOTE — Progress Notes (Signed)
Report to PACU, RN, vss, BBS= Clear.  

## 2014-12-19 NOTE — Patient Instructions (Signed)
Discharge instructions given. Handouts on hemorrhoids and a high fiber diet. Resume previous medications. YOU HAD AN ENDOSCOPIC PROCEDURE TODAY AT THE [facility]:   Refer to the procedure report that was given to you for any specific questions about what was found during the examination.  If the procedure report does not answer your questions, please call your gastroenterologist to clarify.  If you requested that your care partner not be given the details of your procedure findings, then the procedure report has been included in a sealed envelope for you to review at your convenience later.  YOU SHOULD EXPECT: Some feelings of bloating in the abdomen. Passage of more gas than usual.  Walking can help get rid of the air that was put into your GI tract during the procedure and reduce the bloating. If you had a lower endoscopy (such as a colonoscopy or flexible sigmoidoscopy) you may notice spotting of blood in your stool or on the toilet paper. If you underwent a bowel prep for your procedure, you may not have a normal bowel movement for a few days.  Please Note:  You might notice some irritation and congestion in your nose or some drainage.  This is from the oxygen used during your procedure.  There is no need for concern and it should clear up in a day or so.  SYMPTOMS TO REPORT IMMEDIATELY:   Following lower endoscopy (colonoscopy or flexible sigmoidoscopy):  Excessive amounts of blood in the stool  Significant tenderness or worsening of abdominal pains  Swelling of the abdomen that is new, acute  Fever of 100F or higher   For urgent or emergent issues, a gastroenterologist can be reached at any hour by calling [phone].   DIET: Your first meal following the procedure should be a small meal and then it is ok to progress to your normal diet. Heavy or fried foods are harder to digest and may make you feel nauseous or bloated.  Likewise, meals heavy in dairy and vegetables  can increase bloating.  Drink plenty of fluids but you should avoid alcoholic beverages for 24 hours.  ACTIVITY:  You should plan to take it easy for the rest of today and you should NOT DRIVE or use heavy machinery until tomorrow (because of the sedation medicines used during the test).    FOLLOW UP: Our staff will call the number listed on your records the next business day following your procedure to check on you and address any questions or concerns that you may have regarding the information given to you following your procedure. If we do not reach you, we will leave a message.  However, if you are feeling well and you are not experiencing any problems, there is no need to return our call.  We will assume that you have returned to your regular daily activities without incident.  If any biopsies were taken you will be contacted by phone or by letter within the next 1-3 weeks.  Please call us at [phone] if you have not heard about the biopsies in 3 weeks.    SIGNATURES/CONFIDENTIALITY: You and/or your care partner have signed paperwork which will be entered into your electronic medical record.  These signatures attest to the fact that that the information above on your After Visit Summary has been reviewed and is understood.  Full responsibility of the confidentiality of this discharge information lies with you and/or your care-partner. 

## 2014-12-20 ENCOUNTER — Telehealth: Payer: Self-pay | Admitting: *Deleted

## 2014-12-20 NOTE — Telephone Encounter (Signed)
  Follow up Call-  Call back number 12/19/2014  Post procedure Call Back phone  # [phone]  Permission to leave phone message Yes     Patient questions:  Do you have a fever, pain , or abdominal swelling? No. Pain Score  0 *  Have you tolerated food without any problems? Yes.    Have you been able to return to your normal activities? Yes.    Do you have any questions about your discharge instructions: Diet   No. Medications  No. Follow up visit  No.  Do you have questions or concerns about your Care? No.  Actions: * If pain score is 4 or above: No action needed, pain <4.

## 2015-01-16 ENCOUNTER — Other Ambulatory Visit: Payer: Self-pay | Admitting: Family Medicine

## 2015-01-17 ENCOUNTER — Ambulatory Visit (INDEPENDENT_AMBULATORY_CARE_PROVIDER_SITE_OTHER): Payer: Medicare Other | Admitting: Family Medicine

## 2015-01-17 ENCOUNTER — Encounter: Payer: Self-pay | Admitting: Family Medicine

## 2015-01-17 VITALS — BP 126/70 | Temp 98.0°F | Ht 60.5 in | Wt 124.0 lb

## 2015-01-17 DIAGNOSIS — E785 Hyperlipidemia, unspecified: Secondary | ICD-10-CM

## 2015-01-17 DIAGNOSIS — R739 Hyperglycemia, unspecified: Secondary | ICD-10-CM | POA: Diagnosis not present

## 2015-01-17 DIAGNOSIS — Z23 Encounter for immunization: Secondary | ICD-10-CM

## 2015-01-17 DIAGNOSIS — Z0000 Encounter for general adult medical examination without abnormal findings: Secondary | ICD-10-CM | POA: Diagnosis not present

## 2015-01-17 LAB — COMPREHENSIVE METABOLIC PANEL
ALBUMIN: 4.3 g/dL (ref 3.5–5.2)
ALT: 17 U/L (ref 0–35)
AST: 19 U/L (ref 0–37)
Alkaline Phosphatase: 57 U/L (ref 39–117)
BILIRUBIN TOTAL: 0.5 mg/dL (ref 0.2–1.2)
BUN: 20 mg/dL (ref 6–23)
CALCIUM: 9.5 mg/dL (ref 8.4–10.5)
CHLORIDE: 103 meq/L (ref 96–112)
CO2: 29 mEq/L (ref 19–32)
CREATININE: 1.07 mg/dL (ref 0.40–1.20)
GFR: 52.88 mL/min — AB (ref >60.00)
Glucose, Bld: 99 mg/dL (ref 70–99)
Potassium: 3.7 mEq/L (ref 3.5–5.1)
Sodium: 140 mEq/L (ref 135–145)
Total Protein: 7.2 g/dL (ref 6.0–8.3)

## 2015-01-17 LAB — LIPID PANEL
CHOLESTEROL: 192 mg/dL (ref 0–200)
HDL: 56.9 mg/dL (ref >39.00)
LDL Cholesterol: 110 mg/dL — ABNORMAL HIGH (ref 0–99)
NONHDL: 134.77
Total CHOL/HDL Ratio: 3
Triglycerides: 125 mg/dL (ref 0.0–149.0)
VLDL: 25 mg/dL (ref 0.0–40.0)

## 2015-01-17 LAB — HEMOGLOBIN A1C: HEMOGLOBIN A1C: 5.8 % (ref 4.6–6.5)

## 2015-01-17 LAB — TSH: TSH: 1 u[IU]/mL (ref 0.35–4.50)

## 2015-01-17 LAB — CBC
HCT: 38.9 % (ref 36.0–46.0)
HEMOGLOBIN: 12.9 g/dL (ref 12.0–15.0)
MCHC: 33.2 g/dL (ref 30.0–36.0)
MCV: 85.1 fl (ref 78.0–100.0)
PLATELETS: 272 10*3/uL (ref 150.0–400.0)
RBC: 4.57 Mil/uL (ref 3.87–5.11)
RDW: 13.9 % (ref 11.5–15.5)
WBC: 6.4 10*3/uL (ref 4.0–10.5)

## 2015-01-17 NOTE — Progress Notes (Signed)
Lori Reddish, MD Phone: [phone]  Subjective:  Patient presents today for their annual physical. Chief complaint-noted.   See problem oriented charting- ROS- full  review of systems was completed and negative except for: anxiety still present but far less severe. Still worried about driving. No chest pain or shortness of breath. No headache or blurry vision.   The following were reviewed and entered/updated in epic: Past Medical History  Diagnosis Date  . Hypertension   . Hyperlipidemia   . Depression   . Chronic kidney disease     stage 3  . Diabetes mellitus without complication ([place])     pre diabetic/per pt in past  . Arthritis     in neck and head  . Cancer ([place])     stage 3 melanoma on right shoulder/had lymph nodes removed   Patient Active Problem List   Diagnosis Date Noted  . Generalized anxiety disorder 11/24/2013    Priority: High  . Depression 09/23/2013    Priority: High  . History of melanoma 09/23/2013    Priority: High  . Hypertension 09/23/2013    Priority: Medium  . Hyperlipidemia 09/23/2013    Priority: Medium  . CKD (chronic kidney disease), stage III 09/23/2013    Priority: Medium  . Hyperglycemia 09/23/2013    Priority: Medium  . Osteopenia 08/14/2014    Priority: Low  . Bright red blood per rectum 12/01/2013    Priority: Low  . Frequent headaches 11/21/2013    Priority: Low  . Arthritis 11/21/2013    Priority: Low  . 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 Loss of weight 02/13/2014   Past Surgical History  Procedure Laterality Date  . Appendectomy    . Tonsillectomy Bilateral     Family History  Problem Relation Age of Onset  . Breast cancer Mother   . Dementia Father   . Colon cancer Neg Hx   . Esophageal cancer Neg Hx   . Rectal cancer Neg Hx   . Stomach cancer Neg Hx     Medications- reviewed and updated Current Outpatient Prescriptions  Medication Sig Dispense Refill  . aspirin 81 MG tablet Take 81 mg  by mouth every other day.    Marland Kitchen buPROPion (WELLBUTRIN XL) 150 MG 24 hr tablet take 1 tablet by mouth once daily 30 tablet 5  . losartan (COZAAR) 50 MG tablet Take 1 tablet (50 mg total) by mouth daily. 90 tablet 3  . Naproxen Sodium (ALEVE PO) Take by mouth as needed.    . pravastatin (PRAVACHOL) 40 MG tablet take 1 tablet by mouth once daily 90 tablet 3  . sertraline (ZOLOFT) 100 MG tablet Take 1 tablet (100 mg total) by mouth daily. 30 tablet 11  . traZODone (DESYREL) 50 MG tablet take 1/2 tablet by mouth at bedtime 45 tablet 3   No current facility-administered medications for this visit.    Allergies-reviewed and updated Allergies  Allergen Reactions  . Benicar [Olmesartan]     Caused nausea and vomiting  . Celebrex [Celecoxib]     Caused nausea and vomiting  . Crestor [Rosuvastatin]     Caused nausea and vomiting  . Fosamax [Alendronate Sodium]     Caused nausea and vomiting    Social History   Social History  . Marital Status: Married    Spouse Name: N/A  . Number of Children: N/A  . Years of Education: N/A   Social History Main Topics  . Smoking status: Never  Smoker   . Smokeless tobacco: Never Used  . Alcohol Use: No  . Drug Use: No  . Sexual Activity: Not on file   Other Topics Concern  . Not on file   Social History Narrative   Married 1963. 2 sons. 4 grandkids. Release for both sons per patient.    Husband Mikki Santee is a patient at [facility]) has dementia      Retired Print production planner.       Hobbies: working with kids (does this at church on tues/thurs), previously gardening and working outside.    [place]    ROS--See HPI   Objective: BP 126/70 mmHg  Temp(Src) 98 F (36.7 C) (Oral)  Ht 5' 0.5" (1.537 m)  Wt 124 lb (56.246 kg)  BMI 23.81 kg/m2 Gen: NAD, resting comfortably HEENT: Mucous membranes are moist. Oropharynx normal Breasts: normal appearance, no masses or tenderness  Neck: no thyromegaly CV: RRR no murmurs rubs or  gallops Lungs: CTAB no crackles, wheeze, rhonchi Abdomen: soft/nontender/nondistended/normal bowel sounds. No rebound or guarding.  Ext: no edema Skin: warm, dry Neuro: grossly normal, moves all extremities, PERRLA  Assessment/Plan:  74 y.o. female presenting for annual physical.  Health Maintenance counseling: 1. Anticipatory guidance: Patient counseled regarding regular dental exams, eye exams, wearing seatbelts.  2. Risk factor reduction:  Advised patient of need for regular exercise and diet rich and fruits and vegetables to reduce risk of heart attack and stroke.  3. Immunizations/screenings/ancillary studies- final pneumovax given today per protocol.  Health Maintenance Due  Topic Date Due  . TETANUS/TDAP - not covered by insurance 02/28/2014   4. Cervical cancer screening- current guidelines past age 86 with no history abnormals- not recommended 5. Breast cancer screening-  breast exam today normal and mammogram 11/28/2013 and scheduling repeat. Mother with breast cancer at age 87 so patient likely to continue.  6. Colon cancer screening - 12/19/14 normal with no repeat needed 7. Skin cancer screening- history of melanoma followed by dermatology. Dr. Wilhemina Bonito [facility] dermatology yearly  GAD and depression stable on zoloft 100mg , wellbutrin 150mg  XL. Also with trazodone for sleep  Hypertension- on losartan 50mg  again. Had dizziness on losartan 100-hctz 12.5mg  so titrated down as low as losartan 25. Controlled.   BP Readings from Last 3 Encounters:  01/17/15 126/70  12/19/14 160/59  12/14/14 142/60   Hyperlipidemia on pravastatin 40mg , controlled year prior  Hyperglycemia- repeat a1c yearly  CKD III- check Cr/GFR today . Does take naproxen about once every 3 months.   Osteopenia mild with worst t score -1.2-Recommend optimizing calcium (1200 mg/day) and vitamin D (800 IU/day) intake. Regular walking advised. Taking MV with recommended doses.   6 month or sooner if  needed  Orders Placed This Encounter  Procedures  . Pneumococcal polysaccharide vaccine 23-valent greater than or equal to 2yo subcutaneous/IM  . CBC    [place]  . Comprehensive metabolic panel    [place]    Order Specific Question:  Has the patient fasted?    Answer:  No  . Hemoglobin A1c    [place]  . TSH    [place]  . Lipid panel    [place]    Order Specific Question:  Has the patient fasted?    Answer:  No

## 2015-01-17 NOTE — Patient Instructions (Signed)
Things look great including blood pressure, stable depression and anxiety.   Labs before you leave including checking on your sugar  You received final pneumonia shot today.

## 2015-01-17 NOTE — Progress Notes (Signed)
Pre visit review using our clinic review tool, if applicable. No additional management support is needed unless otherwise documented below in the visit note. 

## 2015-02-27 ENCOUNTER — Other Ambulatory Visit: Payer: Self-pay | Admitting: Family Medicine

## 2015-03-27 ENCOUNTER — Other Ambulatory Visit: Payer: Self-pay

## 2015-03-27 MED ORDER — SERTRALINE HCL 100 MG PO TABS: 100.0000 mg | ORAL_TABLET | Freq: Every day | ORAL | Status: AC

## 2015-04-05 ENCOUNTER — Ambulatory Visit (INDEPENDENT_AMBULATORY_CARE_PROVIDER_SITE_OTHER): Payer: 59 | Admitting: Psychology

## 2015-04-05 DIAGNOSIS — F321 Major depressive disorder, single episode, moderate: Secondary | ICD-10-CM | POA: Diagnosis not present

## 2015-04-12 ENCOUNTER — Ambulatory Visit (INDEPENDENT_AMBULATORY_CARE_PROVIDER_SITE_OTHER): Payer: 59 | Admitting: Psychology

## 2015-04-12 DIAGNOSIS — F40248 Other situational type phobia: Secondary | ICD-10-CM

## 2015-04-12 DIAGNOSIS — F321 Major depressive disorder, single episode, moderate: Secondary | ICD-10-CM

## 2015-04-23 ENCOUNTER — Ambulatory Visit (INDEPENDENT_AMBULATORY_CARE_PROVIDER_SITE_OTHER): Payer: 59 | Admitting: Psychology

## 2015-04-23 DIAGNOSIS — F40248 Other situational type phobia: Secondary | ICD-10-CM | POA: Diagnosis not present

## 2015-04-23 DIAGNOSIS — F321 Major depressive disorder, single episode, moderate: Secondary | ICD-10-CM | POA: Diagnosis not present

## 2015-04-25 ENCOUNTER — Telehealth: Payer: Self-pay | Admitting: Family Medicine

## 2015-04-25 NOTE — Telephone Encounter (Signed)
I called both #s and no answer  Dr. Glennon Hamilton mentioned patient wanted to trial lexapro again  Please confirm this with her, then stop her zoloft and start lexapro 20mg  #90 with 3 refills.   Have her see me ni 4-6 weeks after change. If any thoughts of hurting herself reach out to [place] or 911 immediately

## 2015-04-26 ENCOUNTER — Telehealth: Payer: Self-pay | Admitting: Family Medicine

## 2015-04-26 ENCOUNTER — Other Ambulatory Visit: Payer: Self-pay

## 2015-04-26 MED ORDER — ESCITALOPRAM OXALATE 20 MG PO TABS: 20.0000 mg | ORAL_TABLET | Freq: Every day | ORAL | Status: DC

## 2015-04-26 NOTE — Telephone Encounter (Signed)
Yes continue wellbutrin

## 2015-04-26 NOTE — Telephone Encounter (Signed)
Pt said she takes the following med sertraline (ZOLOFT) 100 MG tablet  She said she is going to start taking  escitalopram (LEXAPRO) 20 MG tablet  She is asking if she need to discontinue sertraline (ZOLOFT) 100 MG tablet  Would like a call back

## 2015-04-26 NOTE — Telephone Encounter (Signed)
Pt notified and Lexapro sent in. Pt wanted to know if you still want her to take [facility] or should she stop that as well?

## 2015-04-26 NOTE — Telephone Encounter (Signed)
Pt notified to continue wellbutrin.

## 2015-04-26 NOTE — Telephone Encounter (Signed)
Spoke with pt earlier and these questions were addressed. Pt verbalized understanding.

## 2015-05-10 ENCOUNTER — Ambulatory Visit (INDEPENDENT_AMBULATORY_CARE_PROVIDER_SITE_OTHER): Payer: 59 | Admitting: Psychology

## 2015-05-10 DIAGNOSIS — F40248 Other situational type phobia: Secondary | ICD-10-CM

## 2015-05-10 DIAGNOSIS — F321 Major depressive disorder, single episode, moderate: Secondary | ICD-10-CM

## 2015-05-17 ENCOUNTER — Ambulatory Visit (INDEPENDENT_AMBULATORY_CARE_PROVIDER_SITE_OTHER): Payer: 59 | Admitting: Psychology

## 2015-05-17 DIAGNOSIS — F321 Major depressive disorder, single episode, moderate: Secondary | ICD-10-CM

## 2015-05-17 DIAGNOSIS — F40248 Other situational type phobia: Secondary | ICD-10-CM

## 2015-05-25 ENCOUNTER — Ambulatory Visit (INDEPENDENT_AMBULATORY_CARE_PROVIDER_SITE_OTHER): Payer: 59 | Admitting: Psychology

## 2015-05-25 DIAGNOSIS — F40248 Other situational type phobia: Secondary | ICD-10-CM

## 2015-05-25 DIAGNOSIS — F321 Major depressive disorder, single episode, moderate: Secondary | ICD-10-CM | POA: Diagnosis not present

## 2015-05-31 ENCOUNTER — Ambulatory Visit (INDEPENDENT_AMBULATORY_CARE_PROVIDER_SITE_OTHER): Payer: 59 | Admitting: Psychology

## 2015-05-31 DIAGNOSIS — F40248 Other situational type phobia: Secondary | ICD-10-CM | POA: Diagnosis not present

## 2015-05-31 DIAGNOSIS — F321 Major depressive disorder, single episode, moderate: Secondary | ICD-10-CM | POA: Diagnosis not present

## 2015-06-07 ENCOUNTER — Ambulatory Visit (INDEPENDENT_AMBULATORY_CARE_PROVIDER_SITE_OTHER): Payer: 59 | Admitting: Psychology

## 2015-06-07 DIAGNOSIS — F40248 Other situational type phobia: Secondary | ICD-10-CM

## 2015-06-07 DIAGNOSIS — F321 Major depressive disorder, single episode, moderate: Secondary | ICD-10-CM | POA: Diagnosis not present

## 2015-06-14 ENCOUNTER — Ambulatory Visit: Payer: 59 | Admitting: Psychology

## 2015-06-21 ENCOUNTER — Ambulatory Visit (INDEPENDENT_AMBULATORY_CARE_PROVIDER_SITE_OTHER): Payer: 59 | Admitting: Psychology

## 2015-06-21 DIAGNOSIS — F321 Major depressive disorder, single episode, moderate: Secondary | ICD-10-CM

## 2015-06-21 DIAGNOSIS — F40248 Other situational type phobia: Secondary | ICD-10-CM | POA: Diagnosis not present

## 2015-06-28 ENCOUNTER — Ambulatory Visit (INDEPENDENT_AMBULATORY_CARE_PROVIDER_SITE_OTHER): Payer: 59 | Admitting: Psychology

## 2015-06-28 DIAGNOSIS — F40248 Other situational type phobia: Secondary | ICD-10-CM | POA: Diagnosis not present

## 2015-06-28 DIAGNOSIS — F321 Major depressive disorder, single episode, moderate: Secondary | ICD-10-CM | POA: Diagnosis not present

## 2015-07-05 ENCOUNTER — Ambulatory Visit (INDEPENDENT_AMBULATORY_CARE_PROVIDER_SITE_OTHER): Payer: 59 | Admitting: Psychology

## 2015-07-05 DIAGNOSIS — F40248 Other situational type phobia: Secondary | ICD-10-CM | POA: Diagnosis not present

## 2015-07-05 DIAGNOSIS — F321 Major depressive disorder, single episode, moderate: Secondary | ICD-10-CM

## 2015-07-12 ENCOUNTER — Ambulatory Visit (INDEPENDENT_AMBULATORY_CARE_PROVIDER_SITE_OTHER): Payer: 59 | Admitting: Psychology

## 2015-07-12 DIAGNOSIS — F321 Major depressive disorder, single episode, moderate: Secondary | ICD-10-CM

## 2015-07-12 DIAGNOSIS — F40248 Other situational type phobia: Secondary | ICD-10-CM

## 2015-07-18 ENCOUNTER — Ambulatory Visit (INDEPENDENT_AMBULATORY_CARE_PROVIDER_SITE_OTHER): Payer: Medicare Other | Admitting: Family Medicine

## 2015-07-18 ENCOUNTER — Encounter: Payer: Self-pay | Admitting: Family Medicine

## 2015-07-18 VITALS — BP 156/80 | HR 69 | Temp 98.7°F | Ht 60.5 in | Wt 139.0 lb

## 2015-07-18 DIAGNOSIS — F329 Major depressive disorder, single episode, unspecified: Secondary | ICD-10-CM

## 2015-07-18 DIAGNOSIS — R739 Hyperglycemia, unspecified: Secondary | ICD-10-CM

## 2015-07-18 DIAGNOSIS — I10 Essential (primary) hypertension: Secondary | ICD-10-CM

## 2015-07-18 DIAGNOSIS — E785 Hyperlipidemia, unspecified: Secondary | ICD-10-CM

## 2015-07-18 DIAGNOSIS — F32A Depression, unspecified: Secondary | ICD-10-CM

## 2015-07-18 MED ORDER — LOSARTAN POTASSIUM-HCTZ 50-12.5 MG PO TABS: 1.0000 | ORAL_TABLET | Freq: Every day | ORAL | Status: DC

## 2015-07-18 NOTE — Assessment & Plan Note (Signed)
GAD and depression- has been seeing our psychologist for counseling as well as psychiatrist- now taking 200mg  and off wellbutrin per psych- has visit in mid July. Counseling provided- discouraged she is not better but in many ways improved- drove today, does not cry throughout visit, enjoying time with friends- main issue is loneliness when she is back home

## 2015-07-18 NOTE — Assessment & Plan Note (Signed)
S: reasonably controlled on pravastatin 40mg - would prefer LDL <100 but has reduced cardiac risk and benefit over 75 for primary prevention of statin unclear. No myalgias.  Lab Results  Component Value Date   CHOL 192 01/17/2015   HDL 56.90 01/17/2015   LDLCALC 110* 01/17/2015   TRIG 125.0 01/17/2015   CHOLHDL 3 01/17/2015   A/P: continue current medications, will tolerate slightly elevated ldl

## 2015-07-18 NOTE — Assessment & Plan Note (Signed)
a1c 5.8- needs to watch weight- advised to let current weight being ceiling

## 2015-07-18 NOTE — Progress Notes (Signed)
Pre visit review using our clinic review tool, if applicable. No additional management support is needed unless otherwise documented below in the visit note. 

## 2015-07-18 NOTE — Progress Notes (Signed)
Subjective:  Lori Sharp is a 75 y.o. year old very pleasant female patient who presents for/with See problem oriented charting ROS- No chest pain or shortness of breath. No headache or blurry vision.see any ROS included in HPI as well.   Past Medical History-  Patient Active Problem List   Diagnosis Date Noted  . Generalized anxiety disorder 11/24/2013    Priority: High  . Depression 09/23/2013    Priority: High  . History of melanoma 09/23/2013    Priority: High  . Hypertension 09/23/2013    Priority: Medium  . Hyperlipidemia 09/23/2013    Priority: Medium  . CKD (chronic kidney disease), stage III 09/23/2013    Priority: Medium  . Hyperglycemia 09/23/2013    Priority: Medium  . Osteopenia 08/14/2014    Priority: Low  . Bright red blood per rectum 12/01/2013    Priority: Low  . Frequent headaches 11/21/2013    Priority: Low  . Arthritis 11/21/2013    Priority: Low  . 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 Loss of weight 02/13/2014    Medications- reviewed and updated Current Outpatient Prescriptions  Medication Sig Dispense Refill  . aspirin 81 MG tablet Take 81 mg by mouth every other day.    . losartan (COZAAR) 50 MG tablet take 1 tablet by mouth once daily 90 tablet 3  . Naproxen Sodium (ALEVE PO) Take by mouth as needed.    . pravastatin (PRAVACHOL) 40 MG tablet take 1 tablet by mouth once daily 90 tablet 3  . sertraline (ZOLOFT) 100 MG tablet Take 2 tablet (200 mg total) by mouth daily. 30 tablet 11   No current facility-administered medications for this visit.    Objective: BP 156/80 mmHg  Pulse 69  Temp(Src) 98.7 F (37.1 C) (Oral)  Ht 5' 0.5" (1.537 m)  Wt 139 lb (63.05 kg)  BMI 26.69 kg/m2  SpO2 97% Gen: NAD, resting comfortably, tearful at times in visit CV: RRR no murmurs rubs or gallops Lungs: CTAB no crackles, wheeze, rhonchi Abdomen: soft/nontender/nondistended/normal bowel sounds. thin.  Ext: no edema Skin: warm, dry, no  rash Neuro: grossly normal, moves all extremities  Assessment/Plan:  Hypertension S: poorly controlled on losartan 50mg  BP Readings from Last 3 Encounters:  07/18/15 156/80  01/17/15 126/70  12/19/14 160/59  A/P:has gained over 10 lbs- may need to go back to prior losartan-hctz 100-25mg  dose but we will start with 50-12.5mg  of losartan-hctz  Hyperlipidemia S: reasonably controlled on pravastatin 40mg - would prefer LDL <100 but has reduced cardiac risk and benefit over 75 for primary prevention of statin unclear. No myalgias.  Lab Results  Component Value Date   CHOL 192 01/17/2015   HDL 56.90 01/17/2015   LDLCALC 110* 01/17/2015   TRIG 125.0 01/17/2015   CHOLHDL 3 01/17/2015   A/P: continue current medications, will tolerate slightly elevated ldl   Hyperglycemia a1c 5.8- needs to watch weight- advised to let current weight being ceiling  Depression GAD and depression- has been seeing our psychologist for counseling as well as psychiatrist- now taking 200mg  and off wellbutrin per psych- has visit in mid July. Counseling provided- discouraged she is not better but in many ways improved- drove today, does not cry throughout visit, enjoying time with friends- main issue is loneliness when she is back home    Return in about 3 weeks (around 08/08/2015).  Meds ordered this encounter  Medications  . losartan-hydrochlorothiazide (HYZAAR) 50-12.5 MG tablet    Sig: Take  1 tablet by mouth daily.    Dispense:  30 tablet    Refill:  5   The duration of face-to-face time during this visit was 25 minutes. Greater than 50% of this time was spent in counseling, explanation of diagnosis, planning of further management, and/or coordination of care.    Return precautions advised.  Garret Reddish, MD

## 2015-07-18 NOTE — Assessment & Plan Note (Addendum)
S: poorly controlled on losartan 50mg  BP Readings from Last 3 Encounters:  07/18/15 156/80  01/17/15 126/70  12/19/14 160/59  A/P:has gained over 10 lbs- may need to go back to prior losartan-hctz 100-25mg  dose but we will start with 50-12.5mg  of losartan-hctz

## 2015-07-18 NOTE — Patient Instructions (Signed)
Add hctz back into your losartan pill. You will take 1 combination pill a day starting tomorrow.   See me back in 3 weeks so we can recheck blood pressure- you may need to go back to prior full dose 100-12.5 but for now we will trial 50-12.5.   Stay on pravastatin daily and aspirin every other day.

## 2015-07-19 ENCOUNTER — Ambulatory Visit (INDEPENDENT_AMBULATORY_CARE_PROVIDER_SITE_OTHER): Payer: 59 | Admitting: Psychology

## 2015-07-19 DIAGNOSIS — F40248 Other situational type phobia: Secondary | ICD-10-CM | POA: Diagnosis not present

## 2015-07-19 DIAGNOSIS — F321 Major depressive disorder, single episode, moderate: Secondary | ICD-10-CM | POA: Diagnosis not present

## 2015-07-26 ENCOUNTER — Ambulatory Visit (INDEPENDENT_AMBULATORY_CARE_PROVIDER_SITE_OTHER): Payer: 59 | Admitting: Psychology

## 2015-07-26 DIAGNOSIS — F40248 Other situational type phobia: Secondary | ICD-10-CM

## 2015-07-26 DIAGNOSIS — F321 Major depressive disorder, single episode, moderate: Secondary | ICD-10-CM | POA: Diagnosis not present

## 2015-08-09 ENCOUNTER — Ambulatory Visit (INDEPENDENT_AMBULATORY_CARE_PROVIDER_SITE_OTHER): Payer: 59 | Admitting: Psychology

## 2015-08-09 DIAGNOSIS — F321 Major depressive disorder, single episode, moderate: Secondary | ICD-10-CM

## 2015-08-09 DIAGNOSIS — F40248 Other situational type phobia: Secondary | ICD-10-CM

## 2015-08-13 ENCOUNTER — Ambulatory Visit: Payer: Self-pay | Admitting: Family Medicine

## 2015-08-14 ENCOUNTER — Ambulatory Visit (INDEPENDENT_AMBULATORY_CARE_PROVIDER_SITE_OTHER): Payer: Medicare Other | Admitting: Family Medicine

## 2015-08-14 ENCOUNTER — Encounter: Payer: Self-pay | Admitting: Family Medicine

## 2015-08-14 VITALS — BP 132/70 | HR 76 | Temp 98.5°F | Ht 60.5 in | Wt 138.4 lb

## 2015-08-14 DIAGNOSIS — R739 Hyperglycemia, unspecified: Secondary | ICD-10-CM | POA: Diagnosis not present

## 2015-08-14 DIAGNOSIS — F411 Generalized anxiety disorder: Secondary | ICD-10-CM | POA: Diagnosis not present

## 2015-08-14 DIAGNOSIS — N183 Chronic kidney disease, stage 3 unspecified: Secondary | ICD-10-CM

## 2015-08-14 DIAGNOSIS — E785 Hyperlipidemia, unspecified: Secondary | ICD-10-CM

## 2015-08-14 DIAGNOSIS — I10 Essential (primary) hypertension: Secondary | ICD-10-CM | POA: Diagnosis not present

## 2015-08-14 LAB — LDL CHOLESTEROL, DIRECT: Direct LDL: 120 mg/dL

## 2015-08-14 LAB — BASIC METABOLIC PANEL
BUN: 14 mg/dL (ref 6–23)
CHLORIDE: 102 meq/L (ref 96–112)
CO2: 32 meq/L (ref 19–32)
CREATININE: 0.97 mg/dL (ref 0.40–1.20)
Calcium: 9.3 mg/dL (ref 8.4–10.5)
GFR: 59.13 mL/min — ABNORMAL LOW (ref >60.00)
Glucose, Bld: 93 mg/dL (ref 70–99)
Potassium: 3.4 mEq/L — ABNORMAL LOW (ref 3.5–5.1)
Sodium: 139 mEq/L (ref 135–145)

## 2015-08-14 LAB — HEMOGLOBIN A1C: Hgb A1c MFr Bld: 5.9 % (ref 4.6–6.5)

## 2015-08-14 NOTE — Progress Notes (Signed)
Subjective:  Lori Sharp is a 75 y.o. year old very pleasant female patient who presents for/with See problem oriented charting ROS- No chest pain or shortness of breath. No blurry vision. No SI/HI. Marland Kitchensee any ROS included in HPI as well.   Past Medical History-  Patient Active Problem List   Diagnosis Date Noted  . Generalized anxiety disorder 11/24/2013    Priority: High  . Depression 09/23/2013    Priority: High  . History of melanoma 09/23/2013    Priority: High  . Hypertension 09/23/2013    Priority: Medium  . Hyperlipidemia 09/23/2013    Priority: Medium  . CKD (chronic kidney disease), stage III 09/23/2013    Priority: Medium  . Hyperglycemia 09/23/2013    Priority: Medium  . Osteopenia 08/14/2014    Priority: Low  . Bright red blood per rectum 12/01/2013    Priority: Low  . Frequent headaches 11/21/2013    Priority: Low  . Arthritis 11/21/2013    Priority: Low  . 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 Loss of weight 02/13/2014    Medications- reviewed and updated Current Outpatient Prescriptions  Medication Sig Dispense Refill  . aspirin 81 MG tablet Take 81 mg by mouth every other day.    . losartan (COZAAR) 50 MG tablet take 1 tablet by mouth once daily 90 tablet 3  . losartan-hydrochlorothiazide (HYZAAR) 50-12.5 MG tablet Take 1 tablet by mouth daily. 30 tablet 5  . Naproxen Sodium (ALEVE PO) Take by mouth as needed.    . pravastatin (PRAVACHOL) 40 MG tablet take 1 tablet by mouth once daily 90 tablet 3  . sertraline (ZOLOFT) 100 MG tablet Take 1 tablet (100 mg total) by mouth daily. 30 tablet 11   No current facility-administered medications for this visit.    Objective: BP 132/70 mmHg  Pulse 76  Temp(Src) 98.5 F (36.9 C) (Oral)  Ht 5' 0.5" (1.537 m)  Wt 138 lb 6.4 oz (62.778 kg)  BMI 26.57 kg/m2  SpO2 95% Gen: NAD, resting comfortably CV: RRR no murmurs rubs or gallops Lungs: CTAB no crackles, wheeze, rhonchi Abdomen:  soft/nontender/nondistended/normal bowel sounds. No rebound or guarding.  Ext: no edema Skin: warm, dry Neuro: grossly normal, moves all extremities Psych: anxious appearing, not tearful during visit  Assessment/Plan:  Generalized anxiety disorder  medications being adjusted by psychiatry. Still not where she wants to be. About to start abilify. No SI/HI  Hypertension S: controlled. On losartan 50-12.5 mg . Recent adjustment BP Readings from Last 3 Encounters:  08/14/15 132/70  07/18/15 156/80  01/17/15 126/70  A/P:Continue current meds:  Check bmet with adding back on hctz and known ckd  Hyperlipidemia S: mild poorly controlled on pravastatin 40mg - no history CVA or MI though. No myalgias.  Lab Results  Component Value Date   CHOL 192 01/17/2015   HDL 56.90 01/17/2015   LDLCALC 110* 01/17/2015   TRIG 125.0 01/17/2015   CHOLHDL 3 01/17/2015   A/P: will tolerate current dose as offers some primary prevention but benefit/risks unclear given age and no MI/CVA history. Check direct ldl  Hyperglycemia Remains at risk for diabetes but not elevated further on last check- check a1c today Lab Results  Component Value Date   HGBA1C 5.8 01/17/2015    CKD (chronic kidney disease), stage III S: GFR stable in 50s A/P: update GFR/creatinine today    Return in about 6 weeks (around 09/25/2015).  Orders Placed This Encounter  Procedures  . Hemoglobin  A1c    [place]  . Basic metabolic panel    [place]  . LDL cholesterol, direct    [place]    Meds ordered this encounter  Medications  . ARIPiprazole (ABILIFY) 5 MG tablet    Sig: Through psychiatry- half pill    Return precautions advised.  Garret Reddish, MD

## 2015-08-14 NOTE — Assessment & Plan Note (Signed)
S: mild poorly controlled on pravastatin 40mg - no history CVA or MI though. No myalgias.  Lab Results  Component Value Date   CHOL 192 01/17/2015   HDL 56.90 01/17/2015   LDLCALC 110* 01/17/2015   TRIG 125.0 01/17/2015   CHOLHDL 3 01/17/2015   A/P: will tolerate current dose as offers some primary prevention but benefit/risks unclear given age and no MI/CVA history. Check direct ldl

## 2015-08-14 NOTE — Assessment & Plan Note (Signed)
S: controlled. On losartan 50-12.5 mg . Recent adjustment BP Readings from Last 3 Encounters:  08/14/15 132/70  07/18/15 156/80  01/17/15 126/70  A/P:Continue current meds:  Check bmet with adding back on hctz and known ckd

## 2015-08-14 NOTE — Patient Instructions (Signed)
Labs before you go  Hopeful the new medicine helps the crying  Blood pressure looks great  No changes in medicine today

## 2015-08-14 NOTE — Assessment & Plan Note (Signed)
medications being adjusted by psychiatry. Still not where she wants to be. About to start abilify. No SI/HI

## 2015-08-14 NOTE — Assessment & Plan Note (Signed)
Remains at risk for diabetes but not elevated further on last check- check a1c today Lab Results  Component Value Date   HGBA1C 5.8 01/17/2015

## 2015-08-14 NOTE — Progress Notes (Signed)
Pre visit review using our clinic review tool, if applicable. No additional management support is needed unless otherwise documented below in the visit note. 

## 2015-08-14 NOTE — Assessment & Plan Note (Signed)
S: GFR stable in 50s A/P: update GFR/creatinine today

## 2015-08-16 ENCOUNTER — Ambulatory Visit: Payer: 59 | Admitting: Psychology

## 2015-08-23 ENCOUNTER — Ambulatory Visit (INDEPENDENT_AMBULATORY_CARE_PROVIDER_SITE_OTHER): Payer: 59 | Admitting: Psychology

## 2015-08-23 DIAGNOSIS — F321 Major depressive disorder, single episode, moderate: Secondary | ICD-10-CM | POA: Diagnosis not present

## 2015-08-23 DIAGNOSIS — F40248 Other situational type phobia: Secondary | ICD-10-CM | POA: Diagnosis not present

## 2015-09-06 ENCOUNTER — Ambulatory Visit (INDEPENDENT_AMBULATORY_CARE_PROVIDER_SITE_OTHER): Payer: 59 | Admitting: Psychology

## 2015-09-06 DIAGNOSIS — F321 Major depressive disorder, single episode, moderate: Secondary | ICD-10-CM

## 2015-09-06 DIAGNOSIS — F40248 Other situational type phobia: Secondary | ICD-10-CM

## 2015-09-13 ENCOUNTER — Ambulatory Visit: Payer: 59 | Admitting: Psychology

## 2015-09-20 ENCOUNTER — Ambulatory Visit: Payer: 59 | Admitting: Psychology

## 2015-09-24 ENCOUNTER — Ambulatory Visit: Payer: Self-pay | Admitting: Family Medicine

## 2015-09-25 ENCOUNTER — Ambulatory Visit (INDEPENDENT_AMBULATORY_CARE_PROVIDER_SITE_OTHER): Payer: Medicare Other | Admitting: Family Medicine

## 2015-09-25 ENCOUNTER — Encounter: Payer: Self-pay | Admitting: Family Medicine

## 2015-09-25 VITALS — BP 130/68 | HR 70 | Temp 98.5°F | Wt 138.6 lb

## 2015-09-25 DIAGNOSIS — S0091XA Abrasion of unspecified part of head, initial encounter: Secondary | ICD-10-CM

## 2015-09-25 DIAGNOSIS — Z23 Encounter for immunization: Secondary | ICD-10-CM | POA: Diagnosis not present

## 2015-09-25 DIAGNOSIS — E785 Hyperlipidemia, unspecified: Secondary | ICD-10-CM

## 2015-09-25 DIAGNOSIS — I10 Essential (primary) hypertension: Secondary | ICD-10-CM | POA: Diagnosis not present

## 2015-09-25 MED ORDER — ATORVASTATIN CALCIUM 20 MG PO TABS: 20.0000 mg | ORAL_TABLET | Freq: Every day | ORAL | 5 refills | Status: DC

## 2015-09-25 NOTE — Patient Instructions (Addendum)
High dose flu shot  Td today (tetanus shot given abrasion to scalp)  Please follow this closely- if you were to have worsening headache need to see [place]. Would consider imaging  Try atorvastatin 20mg  instead of pravastatin 40mg .   Blood pressure looks great

## 2015-09-25 NOTE — Assessment & Plan Note (Signed)
S: mild poorly controlled on pravastain 40mg  with no CVA or MI history. No myalgias.  Lab Results  Component Value Date   CHOL 192 01/17/2015   HDL 56.90 01/17/2015   LDLCALC 110 (H) 01/17/2015   LDLDIRECT 120.0 08/14/2015   TRIG 125.0 01/17/2015   CHOLHDL 3 01/17/2015   A/P: we discussed may be able to decrease cardiac risk by intensifying statin with LDL goal at least under 100- change to atorvastatin 20mg . Follow up 3 months with direct LDL likely

## 2015-09-25 NOTE — Progress Notes (Signed)
Pre visit review using our clinic review tool, if applicable. No additional management support is needed unless otherwise documented below in the visit note. 

## 2015-09-25 NOTE — Addendum Note (Signed)
Addended by: Mariam Dollar, Roselyn Reef M on: 09/25/2015 10:14 AM   Modules accepted: Orders

## 2015-09-25 NOTE — Progress Notes (Signed)
Subjective:  Lori Sharp is a 75 y.o. year old very pleasant female patient who presents for/with See problem oriented charting ROS- No chest pain or shortness of breath. No headache or blurry vision.see any ROS included in HPI as well.   Past Medical History-  Patient Active Problem List   Diagnosis Date Noted  . Generalized anxiety disorder 11/24/2013    Priority: High  . Depression 09/23/2013    Priority: High  . History of melanoma 09/23/2013    Priority: High  . Hypertension 09/23/2013    Priority: Medium  . Hyperlipidemia 09/23/2013    Priority: Medium  . CKD (chronic kidney disease), stage III 09/23/2013    Priority: Medium  . Hyperglycemia 09/23/2013    Priority: Medium  . Osteopenia 08/14/2014    Priority: Low  . Bright red blood per rectum 12/01/2013    Priority: Low  . Frequent headaches 11/21/2013    Priority: Low  . Arthritis 11/21/2013    Priority: Low  . 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 Loss of weight 02/13/2014    Medications- reviewed and updated Current Outpatient Prescriptions  Medication Sig Dispense Refill  . ARIPiprazole (ABILIFY) 5 MG tablet Through psychiatry- half pill    . aspirin 81 MG tablet Take 81 mg by mouth every other day.    . losartan-hydrochlorothiazide (HYZAAR) 50-12.5 MG tablet Take 1 tablet by mouth daily. 30 tablet 5  . Naproxen Sodium (ALEVE PO) Take by mouth as needed.    . pravastatin (PRAVACHOL) 40 MG tablet take 1 tablet by mouth once daily 90 tablet 3  . sertraline (ZOLOFT) 100 MG tablet Take 1 tablet (100 mg total) by mouth daily. 30 tablet 11   No current facility-administered medications for this visit.     Objective: BP 130/68 (BP Location: Left Arm, Patient Position: Sitting, Cuff Size: Normal)   Pulse 70   Temp 98.5 F (36.9 C) (Oral)   Wt 138 lb 9.6 oz (62.9 kg)   SpO2 96%   BMI 26.62 kg/m  Gen: NAD, resting comfortably CV: RRR no murmurs rubs or gallops Lungs: CTAB no crackles,  wheeze, rhonchi Abdomen: soft/nontender/nondistended/normal bowel sounds. No rebound or guarding.  Ext: no edema Skin: warm, dry Neuro: CN II-XII intact, sensation and reflexes normal throughout, 5/5 muscle strength in bilateral upper and lower extremities. Normal finger to nose. Normal rapid alternating movements. No pronator drift. Normal romberg. Normal gait.   Assessment/Plan:  Hypertension S: controlled. On losartan-hct 50-12.5mg .  BP Readings from Last 3 Encounters:  09/25/15 130/68  08/14/15 132/70  07/18/15 (!) 156/80  A/P:Continue current meds:  Tolerating dose without dizziness- except for mild intermittent as noted below- but that is just after head abrasion  Abrasion left forehead S: patient was walking in her complex and tripped over some objects that were on the sidewalk. She fell first to her knees then down on her side- fell out into some grass and leaves. She had an abrasion on her left forehead. She has had the mildest of headaches near the spot and also very mild intermittent dizziness ROS- No facial or extremity weakness. No slurred words or trouble swallowing. no blurry vision or double vision. No paresthesias. No confusion or word finding difficulties.  A/P: seems fall was somewhat slow velocity- we considered CT but reassuring exam and history- will update TD given abrasion. Strict return precautions given.   Hyperlipidemia S: mild poorly controlled on pravastain 40mg  with no CVA or MI history.  No myalgias.  Lab Results  Component Value Date   CHOL 192 01/17/2015   HDL 56.90 01/17/2015   LDLCALC 110 (H) 01/17/2015   LDLDIRECT 120.0 08/14/2015   TRIG 125.0 01/17/2015   CHOLHDL 3 01/17/2015   A/P: we discussed may be able to decrease cardiac risk by intensifying statin with LDL goal at least under 100- change to atorvastatin 20mg . Follow up 3 months with direct LDL likely   Return in about 3 months (around 12/26/2015).  Td and high dose flu given  Meds  ordered this encounter  Medications  . atorvastatin (LIPITOR) 20 MG tablet    Sig: Take 1 tablet (20 mg total) by mouth daily.    Dispense:  30 tablet    Refill:  5    Return precautions advised.  Garret Reddish, MD

## 2015-10-11 ENCOUNTER — Ambulatory Visit (INDEPENDENT_AMBULATORY_CARE_PROVIDER_SITE_OTHER): Payer: 59 | Admitting: Psychology

## 2015-10-11 DIAGNOSIS — F321 Major depressive disorder, single episode, moderate: Secondary | ICD-10-CM | POA: Diagnosis not present

## 2015-10-11 DIAGNOSIS — F40248 Other situational type phobia: Secondary | ICD-10-CM

## 2015-11-01 ENCOUNTER — Ambulatory Visit: Payer: 59 | Admitting: Psychology

## 2015-11-22 ENCOUNTER — Ambulatory Visit (INDEPENDENT_AMBULATORY_CARE_PROVIDER_SITE_OTHER): Payer: 59 | Admitting: Psychology

## 2015-11-22 DIAGNOSIS — F321 Major depressive disorder, single episode, moderate: Secondary | ICD-10-CM | POA: Diagnosis not present

## 2015-11-22 DIAGNOSIS — F40248 Other situational type phobia: Secondary | ICD-10-CM

## 2015-12-24 ENCOUNTER — Ambulatory Visit: Payer: Medicare Other | Admitting: Family Medicine

## 2016-01-29 ENCOUNTER — Ambulatory Visit: Payer: Medicare Other | Admitting: Family Medicine

## 2016-02-28 ENCOUNTER — Ambulatory Visit (INDEPENDENT_AMBULATORY_CARE_PROVIDER_SITE_OTHER): Payer: Medicare Other | Admitting: Family Medicine

## 2016-02-28 ENCOUNTER — Encounter: Payer: Self-pay | Admitting: Family Medicine

## 2016-02-28 DIAGNOSIS — F411 Generalized anxiety disorder: Secondary | ICD-10-CM

## 2016-02-28 DIAGNOSIS — I10 Essential (primary) hypertension: Secondary | ICD-10-CM | POA: Diagnosis not present

## 2016-02-28 DIAGNOSIS — N183 Chronic kidney disease, stage 3 unspecified: Secondary | ICD-10-CM

## 2016-02-28 DIAGNOSIS — E785 Hyperlipidemia, unspecified: Secondary | ICD-10-CM | POA: Diagnosis not present

## 2016-02-28 LAB — COMPREHENSIVE METABOLIC PANEL
ALK PHOS: 71 U/L (ref 39–117)
ALT: 12 U/L (ref 0–35)
AST: 15 U/L (ref 0–37)
Albumin: 4.6 g/dL (ref 3.5–5.2)
BILIRUBIN TOTAL: 0.4 mg/dL (ref 0.2–1.2)
BUN: 18 mg/dL (ref 6–23)
CO2: 31 mEq/L (ref 19–32)
CREATININE: 0.93 mg/dL (ref 0.40–1.20)
Calcium: 9.3 mg/dL (ref 8.4–10.5)
Chloride: 104 mEq/L (ref 96–112)
GFR: 61.98 mL/min (ref >60.00)
GLUCOSE: 95 mg/dL (ref 70–99)
Potassium: 4 mEq/L (ref 3.5–5.1)
SODIUM: 140 meq/L (ref 135–145)
TOTAL PROTEIN: 6.7 g/dL (ref 6.0–8.3)

## 2016-02-28 LAB — POC URINALSYSI DIPSTICK (AUTOMATED)
Bilirubin, UA: NEGATIVE
GLUCOSE UA: NEGATIVE
Ketones, UA: NEGATIVE
Leukocytes, UA: NEGATIVE
Nitrite, UA: NEGATIVE
PROTEIN UA: NEGATIVE
RBC UA: NEGATIVE
UROBILINOGEN UA: 0.2
pH, UA: 5.5

## 2016-02-28 LAB — CBC
HCT: 39.6 % (ref 36.0–46.0)
HEMOGLOBIN: 13.1 g/dL (ref 12.0–15.0)
MCHC: 33.1 g/dL (ref 30.0–36.0)
MCV: 84.8 fl (ref 78.0–100.0)
Platelets: 252 10*3/uL (ref 150.0–400.0)
RBC: 4.67 Mil/uL (ref 3.87–5.11)
RDW: 14.4 % (ref 11.5–15.5)
WBC: 6.9 10*3/uL (ref 4.0–10.5)

## 2016-02-28 LAB — LDL CHOLESTEROL, DIRECT: Direct LDL: 188 mg/dL

## 2016-02-28 MED ORDER — AMLODIPINE BESYLATE 2.5 MG PO TABS
2.5000 mg | ORAL_TABLET | Freq: Every day | ORAL | 3 refills | Status: DC
Start: 2016-02-28 — End: 2016-06-27

## 2016-02-28 NOTE — Assessment & Plan Note (Signed)
zoloft 100mg , abilify 5mg - Dr. Clovis Pu. States doing very well from depression and anxiety perspective. Excited for Dr. Glennon Hamilton to return to she can resume counseling

## 2016-02-28 NOTE — Patient Instructions (Addendum)
Labs before you leave  Start amlodipine 2.5mg   Have to drink more fluids- should help with the dizziness

## 2016-02-28 NOTE — Assessment & Plan Note (Signed)
S: need better control of BP. Nausea and orthostasis on losartan.  A/P: trial amlodipine for BP. Need good lipid and BP control to avoid progression. Has not tolerated arb but not clearly proteinuric.

## 2016-02-28 NOTE — Assessment & Plan Note (Signed)
S: controlled on losartan-hct 50-12.5mg  in the past then down to 25mg  losartan alone and she was nauseous with this so stopped. Was also getting nauseous BP Readings from Last 3 Encounters:  02/28/16 (!) 144/66  09/25/15 130/68  08/14/15 132/70  A/P: some orthostasis- needs to push fluids. Feels weak at times too and could be from dehydration. Will add amlodipine 2.5mg  as want to get [id number] systolic with her CKD III

## 2016-02-28 NOTE — Progress Notes (Signed)
Subjective:  Lori Sharp is a 76 y.o. year old very pleasant female patient who presents for/with See problem oriented charting ROS- No chest pain or shortness of breath. No headache or blurry vision. Some lightheadedness but does not do a good job drinking fluids she admits   Past Medical History-  Patient Active Problem List   Diagnosis Date Noted  . Generalized anxiety disorder 11/24/2013    Priority: High  . Depression 09/23/2013    Priority: High  . History of melanoma 09/23/2013    Priority: High  . Hypertension 09/23/2013    Priority: Medium  . Hyperlipidemia 09/23/2013    Priority: Medium  . CKD (chronic kidney disease), stage III 09/23/2013    Priority: Medium  . Hyperglycemia 09/23/2013    Priority: Medium  . Osteopenia 08/14/2014    Priority: Low  . Bright red blood per rectum 12/01/2013    Priority: Low  . Frequent headaches 11/21/2013    Priority: Low  . Arthritis 11/21/2013    Priority: Low  . 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 Loss of weight 02/13/2014    Medications- reviewed and updated Current Outpatient Prescriptions  Medication Sig Dispense Refill  . ARIPiprazole (ABILIFY) 5 MG tablet Take 5 mg by mouth daily. Through psychiatry    . aspirin 81 MG tablet Take 81 mg by mouth every other day.    Marland Kitchen atorvastatin (LIPITOR) 20 MG tablet Take 1 tablet (20 mg total) by mouth daily. 30 tablet 5  . losartan-hydrochlorothiazide (HYZAAR) 50-12.5 MG tablet Take 1 tablet by mouth daily. 30 tablet 5  . Naproxen Sodium (ALEVE PO) Take by mouth as needed.    . sertraline (ZOLOFT) 100 MG tablet Take 1 tablet (100 mg total) by mouth daily. 30 tablet 11   No current facility-administered medications for this visit.     Objective: BP (!) 144/66 (BP Location: Left Arm, Patient Position: Sitting, Cuff Size: Large)   Pulse 80   Temp 98.1 F (36.7 C) (Oral)   Ht 5' 0.5" (1.537 m)   Wt 138 lb 12.8 oz (63 kg)   SpO2 96%   BMI 26.66 kg/m  Gen:  NAD, resting comfortably Slightly dry mucus membranes CV: RRR no murmurs rubs or gallops Lungs: CTAB no crackles, wheeze, rhonchi  Ext: no edema Skin: warm, dry Neuro: grossly normal, moves all extremities  Assessment/Plan:  Hypertension S: controlled on losartan-hct 50-12.5mg  in the past then down to 25mg  losartan alone and she was nauseous with this so stopped. Was also getting nauseous BP Readings from Last 3 Encounters:  02/28/16 (!) 144/66  09/25/15 130/68  08/14/15 132/70  A/P: some orthostasis- needs to push fluids. Feels weak at times too and could be from dehydration. Will add amlodipine 2.5mg  as want to get [id number] systolic with her CKD III  Generalized anxiety disorder zoloft 100mg , abilify 5mg - Dr. Clovis Pu. States doing very well from depression and anxiety perspective. Excited for Dr. Glennon Hamilton to return to she can resume counseling  Hyperlipidemia S: mild poorly controlled on pravastatin 40mg  so changed to atorvastatin 20mg .    A/P: update direct ldl. Has felt weaker some day ssince starting statin. May consider reducing to 10mg  in future  CKD (chronic kidney disease), stage III S: need better control of BP. Nausea and orthostasis on losartan.  A/P: trial amlodipine for BP. Need good lipid and BP control to avoid progression. Has not tolerated arb but not clearly proteinuric.   4 months  Orders  Placed This Encounter  Procedures  . CBC    [place]  . Comprehensive metabolic panel    [place]  . LDL cholesterol, direct    [place]  . POCT Urinalysis Dipstick (Automated)    Meds ordered this encounter  Medications  . ARIPiprazole (ABILIFY) 5 MG tablet    Sig: Take 5 mg by mouth daily. Through psychiatry  . amLODipine (NORVASC) 2.5 MG tablet    Sig: Take 1 tablet (2.5 mg total) by mouth daily.    Dispense:  90 tablet    Refill:  3    Return precautions advised.  Garret Reddish, MD

## 2016-02-28 NOTE — Assessment & Plan Note (Signed)
S: mild poorly controlled on pravastatin 40mg  so changed to atorvastatin 20mg .    A/P: update direct ldl. Has felt weaker some day ssince starting statin. May consider reducing to 10mg  in future

## 2016-02-28 NOTE — Progress Notes (Signed)
Pre visit review using our clinic review tool, if applicable. No additional management support is needed unless otherwise documented below in the visit note. 

## 2016-02-29 ENCOUNTER — Other Ambulatory Visit: Payer: Self-pay

## 2016-02-29 MED ORDER — ATORVASTATIN CALCIUM 20 MG PO TABS: 20.0000 mg | ORAL_TABLET | Freq: Every day | ORAL | 5 refills | Status: AC

## 2016-06-12 ENCOUNTER — Telehealth: Payer: Self-pay | Admitting: Family Medicine

## 2016-06-12 NOTE — Telephone Encounter (Signed)
Pt would like to have Dr. Ronney Lion opinion on [place] as her podiatrist?

## 2016-06-12 NOTE — Telephone Encounter (Signed)
Yes thanks- they do an excellent job with the foot and nails  If it is primarily a foot/non surgical orthopedic issue of the foot- Dr. Paulla Fore of Sports medicine is excellent and seeing him there would help her get used to the [place] location I will be at in the fall

## 2016-06-13 NOTE — Telephone Encounter (Signed)
Spoke to patient who verbalized understanding. She wants to keep appointment at [facility]

## 2016-06-24 ENCOUNTER — Ambulatory Visit (INDEPENDENT_AMBULATORY_CARE_PROVIDER_SITE_OTHER): Payer: Medicare Other | Admitting: Podiatry

## 2016-06-24 ENCOUNTER — Encounter: Payer: Self-pay | Admitting: Podiatry

## 2016-06-24 VITALS — BP 168/80 | HR 77

## 2016-06-24 DIAGNOSIS — M79674 Pain in right toe(s): Secondary | ICD-10-CM | POA: Diagnosis not present

## 2016-06-24 DIAGNOSIS — M79675 Pain in left toe(s): Secondary | ICD-10-CM | POA: Diagnosis not present

## 2016-06-24 DIAGNOSIS — B351 Tinea unguium: Secondary | ICD-10-CM | POA: Diagnosis not present

## 2016-06-24 NOTE — Progress Notes (Signed)
   Subjective:    Patient ID: Lori Sharp, female    DOB: Feb 04, 1940, 76 y.o.   MRN: [id number]  HPI This patient presents today complaining of thickened and elongated, uncomfortable toenails and requests toenail debridement. Patient states that the nails of gradually thickened more so the 4-6 months patient is no longer able to trim toenails. She denies any other care then attempted self treatment. She denies any history of infection around the toenails. Patient has for a friend present in the treatment room  Patient denies smoking history  Review of Systems  Constitutional: Positive for unexpected weight change.  Skin:       Change in nails       Objective:   Physical Exam  Patient appears responsive and able to respond to questioning, orientated 3  Vascular: DP and PT pulses 2/4 bilaterally Capillary reflex immediate bilaterally  Neurological: Sensation is 10 g monofilament wire intact 4/5 bilaterally Vibratory sensation reactive bilaterally Ankle reflexes reactive bilaterally  Dermatological: No open skin lesions bilaterally Atrophic skin bilaterally The toenails are extremely elongated, deformed, discolored, hypertrophic and tender to direct palpation 6-10  Musculoskeletal: Hammertoe second left Manual motor testing dorsi flexion, plantar flexion, inversion, eversion 5/5 bilaterally        Assessment & Plan:   Assessment: Satisfactory neurovascular status 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 We discussed treatment options including oral medication, topical medication and debridement. I recommended debridement. Patient verbally consents. The toenails 6-10 were debrided mechanically and electrically without any bleeding  Reappoint 3 months

## 2016-06-24 NOTE — Patient Instructions (Signed)
Today your examination demonstrated adequate circulation and feeling your feet All the toenails are extremely thickened and deformed associated with fungal infection. I recommended periodic debridement/trimming of the toenails at three-month intervals

## 2016-06-27 ENCOUNTER — Encounter: Payer: Self-pay | Admitting: Family Medicine

## 2016-06-27 ENCOUNTER — Ambulatory Visit (INDEPENDENT_AMBULATORY_CARE_PROVIDER_SITE_OTHER): Payer: Medicare Other | Admitting: Family Medicine

## 2016-06-27 DIAGNOSIS — I10 Essential (primary) hypertension: Secondary | ICD-10-CM | POA: Diagnosis not present

## 2016-06-27 DIAGNOSIS — E785 Hyperlipidemia, unspecified: Secondary | ICD-10-CM | POA: Diagnosis not present

## 2016-06-27 MED ORDER — AMLODIPINE BESYLATE 5 MG PO TABS: 2.5000 mg | ORAL_TABLET | Freq: Every day | ORAL | 5 refills | Status: DC

## 2016-06-27 MED ORDER — AMLODIPINE BESYLATE 5 MG PO TABS: 5.0000 mg | ORAL_TABLET | Freq: Every day | ORAL | 5 refills | Status: DC

## 2016-06-27 NOTE — Assessment & Plan Note (Signed)
S: hopefully improved controlled on atorvastatin 20mg  up from pravastatin 40mg . No myalgias.  Lab Results  Component Value Date   CHOL 192 01/17/2015   HDL 56.90 01/17/2015   LDLCALC 110 (H) 01/17/2015   LDLDIRECT 188.0 02/28/2016   TRIG 125.0 01/17/2015   CHOLHDL 3 01/17/2015   A/P: agrees to come fasting to next visit and we will update fasting labs- hopeful already improved

## 2016-06-27 NOTE — Assessment & Plan Note (Signed)
S: poorly controlled on amlodipine 2.5mg . Nausea and orthosasis on losartan and losartan hctz pair ASCVD 10 year risk calculation if age 78-79: elevated but already on statin BP Readings from Last 3 Encounters:  06/27/16 (!) 158/72  06/24/16 (!) 168/80  02/28/16 (!) 144/66  A/P: We discussed blood pressure goal of <140/90. Continue current meds:  But increase to 5mg  and start home monitoring and 3 week follow up

## 2016-06-27 NOTE — Progress Notes (Signed)
Subjective:  Lori Sharp is a 76 y.o. year old very pleasant female patient who presents for/with See problem oriented charting ROS- No chest pain or shortness of breath. No headache or blurry vision. Still with some sadness in [place] and misses husband. No SI.   Past Medical History-  Patient Active Problem List   Diagnosis Date Noted  . Generalized anxiety disorder 11/24/2013    Priority: High  . Depression 09/23/2013    Priority: High  . History of melanoma 09/23/2013    Priority: High  . Hypertension 09/23/2013    Priority: Medium  . Hyperlipidemia 09/23/2013    Priority: Medium  . CKD (chronic kidney disease), stage III 09/23/2013    Priority: Medium  . Hyperglycemia 09/23/2013    Priority: Medium  . Osteopenia 08/14/2014    Priority: Low  . Bright red blood per rectum 12/01/2013    Priority: Low  . Frequent headaches 11/21/2013    Priority: Low  . Arthritis 11/21/2013    Priority: Low  . 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 Loss of weight 02/13/2014    Medications- reviewed and updated Current Outpatient Prescriptions  Medication Sig Dispense Refill  . amLODipine (NORVASC) 2.5 MG tablet Take 1 tablet (2.5 mg total) by mouth daily. 90 tablet 3  . ARIPiprazole (ABILIFY) 5 MG tablet Take 5 mg by mouth daily. Through psychiatry    . aspirin 81 MG tablet Take 81 mg by mouth every other day.    Marland Kitchen atorvastatin (LIPITOR) 20 MG tablet Take 1 tablet (20 mg total) by mouth daily. 30 tablet 5  . Naproxen Sodium (ALEVE PO) Take by mouth as needed.    . sertraline (ZOLOFT) 100 MG tablet Take 1 tablet (100 mg total) by mouth daily. 30 tablet 11   No current facility-administered medications for this visit.     Objective: BP (!) 158/72 (BP Location: Left Arm, Patient Position: Sitting, Cuff Size: Large)   Pulse 71   Temp 98.5 F (36.9 C) (Oral)   Ht 5' 0.5" (1.537 m)   Wt 141 lb 3.2 oz (64 kg)   SpO2 96%   BMI 27.12 kg/m  Gen: NAD, resting  comfortably CV: RRR no murmurs rubs or gallops Lungs: CTAB no crackles, wheeze, rhonchi Abdomen: soft/nontender/nondistended/normal bowel sounds. No rebound or guarding.  Ext: trace edema Skin: warm, dry Neuro: grossly normal, moves all extremities  Assessment/Plan:  Hypertension S: poorly controlled on amlodipine 2.5mg . Nausea and orthosasis on losartan and losartan hctz pair ASCVD 10 year risk calculation if age 84-79: elevated but already on statin BP Readings from Last 3 Encounters:  06/27/16 (!) 158/72  06/24/16 (!) 168/80  02/28/16 (!) 144/66  A/P: We discussed blood pressure goal of <140/90. Continue current meds:  But increase to 5mg  and start home monitoring and 3 week follow up  Hyperlipidemia S: hopefully improved controlled on atorvastatin 20mg  up from pravastatin 40mg . No myalgias.  Lab Results  Component Value Date   CHOL 192 01/17/2015   HDL 56.90 01/17/2015   LDLCALC 110 (H) 01/17/2015   LDLDIRECT 188.0 02/28/2016   TRIG 125.0 01/17/2015   CHOLHDL 3 01/17/2015   A/P: agrees to come fasting to next visit and we will update fasting labs- hopeful already improved  3 weeks and AWV before visit with me- wants memory checked as some forgetfulness   Meds ordered this encounter  . amLODipine (NORVASC) 5 MG tablet    Sig: Take 1 tablet (5 mg total)  by mouth daily.    Dispense:  30 tablet    Refill:  5    06/27/16- dose increase to 5mg  daily    Return precautions advised.  Garret Reddish, MD

## 2016-06-27 NOTE — Patient Instructions (Addendum)
Your blood pressure remains up. I would like for you to buy/use a home cuff to check at least 4-5x a week. Your goal is <140/90 or at least less than 150.See me in 3 weeks. Bring your home cuff and your log of blood pressures with you to visit.   No other chnages today  At your follow up visit - I want you to see Lori Sharp before you see me- that will allow [place] to get a more comprehensive memory test done then me and you can follow up on that

## 2016-07-16 NOTE — Progress Notes (Signed)
Pre visit review using our clinic review tool, if applicable. No additional management support is needed unless otherwise documented below in the visit note. 

## 2016-07-16 NOTE — Progress Notes (Signed)
Subjective:   Lori Sharp is a 76 y.o. female who presents for an Initial Medicare Annual Wellness Visit.  Pt states that a few months ago she began experiencing dizziness when she bent over. She states that the dizziness would then make her fall. She states that her injuries included bruises to arms, legs and head. She states that this has gotten better. She states, "Most of the time I can bend over and not get dizzy but sometimes I still get dizzy." She did not seek medical attention and cannot recall if she spoke to Dr Yong Channel about this.   Pt would like Dr Yong Channel to take a look at her legs, she states that her skin is thinning and she is concerned that it may be a side effect of medications.  Pt went to a podiatrist last month and had her toenails cut. She states she will have to go back next month as well because she toenails are growing in sideways.   Review of Systems    No ROS.  Medicare Wellness Visit. Additional risk factors are reflected in the social history.  Cardiac Risk Factors include: advanced age (>28men, >32 women);diabetes mellitus;dyslipidemia;hypertension;obesity (BMI >30kg/m2)   Sleep patterns:  7 hrs/night. Gets up twice to urinate. Gets up feeling tired.  Home Safety/Smoke Alarms: Feels safe in home. Smoke alarms in place.  Living environment; residence and Firearm Safety: Lives in a one story, first floor apartment.  Seat Belt Safety/Bike Helmet: Wears seat belt.   Counseling:   Dental- Last cleaning was 3 years ago.   Female:   Pap-  1 year ago. Pt cannot recall physician. Normal per pt.     Mammo-   11/28/2013 Benign. Pt states she will call her Gynecologist to get her mammogram scheduled.    Dexa scan- 08/09/2014   Osteopenia.    CCS- 12/19/2014 diverticulosis.      Objective:    Today's Vitals   07/17/16 0949  BP: 122/60  Pulse: 78  Resp: 16  Temp: 98.6 F (37 C)  TempSrc: Oral  SpO2: 97%  Weight: 139 lb 9.6 oz (63.3 kg)  Height: 5\' 1"   (1.549 m)   Body mass index is 26.38 kg/m.   Current Medications (verified) Outpatient Encounter Prescriptions as of 07/17/2016  Medication Sig  . amLODipine (NORVASC) 2.5 MG tablet Take 2.5 mg by mouth daily.  . ARIPiprazole (ABILIFY) 5 MG tablet Take 5 mg by mouth daily. Through psychiatry  . aspirin 81 MG tablet Take 81 mg by mouth every other day.  Marland Kitchen atorvastatin (LIPITOR) 20 MG tablet Take 1 tablet (20 mg total) by mouth daily.  . Naproxen Sodium (ALEVE PO) Take by mouth as needed.  . sertraline (ZOLOFT) 100 MG tablet Take 1 tablet (100 mg total) by mouth daily.  Marland Kitchen amLODipine (NORVASC) 5 MG tablet Take 1 tablet (5 mg total) by mouth daily. (Patient not taking: Reported on 07/17/2016)   No facility-administered encounter medications on file as of 07/17/2016.     Allergies (verified) Benicar [olmesartan]; Celebrex [celecoxib]; Crestor [rosuvastatin]; and Fosamax [alendronate sodium]   History: Past Medical History:  Diagnosis Date  . Arthritis    in neck and head  . 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 Chronic kidney disease    stage 3  . Depression   . Diabetes mellitus without complication ([place])    pre diabetic/per pt in past  . Hyperlipidemia   . Hypertension  Past Surgical History:  Procedure Laterality Date  . APPENDECTOMY    . TONSILLECTOMY Bilateral    Family History  Problem Relation Age of Onset  . Breast cancer Mother   . Dementia Father   . Colon cancer Neg Hx   . Esophageal cancer Neg Hx   . Rectal cancer Neg Hx   . Stomach cancer Neg Hx    Social History   Occupational History  . Not on file.   Social History Main Topics  . Smoking status: Never Smoker  . Smokeless tobacco: Never Used  . Alcohol use No  . Drug use: No  . Sexual activity: Not on file    Tobacco Counseling Counseling given: Not Answered   Activities of Daily Living In your present state of health, do you have any difficulty  performing the following activities: 07/17/2016  Hearing? N  Vision? N  Difficulty concentrating or making decisions? Y  Walking or climbing stairs? N  Dressing or bathing? Y  Doing errands, shopping? Y  Preparing Food and eating ? N  Using the Toilet? N  In the past six months, have you accidently leaked urine? N  Do you have problems with loss of bowel control? N  Managing your Medications? N  Managing your Finances? N  Housekeeping or managing your Housekeeping? N  Some recent data might be hidden    Immunizations and Health Maintenance Immunization History  Administered Date(s) Administered  . Influenza, High Dose Seasonal PF 09/25/2015  . Influenza,inj,Quad PF,36+ Mos 10/24/2013, 10/19/2014  . Influenza-Unspecified 02/29/2012  . Pneumococcal Conjugate-13 07/05/2014  . Pneumococcal Polysaccharide-23 01/17/2015  . Pneumococcal-Unspecified 02/28/2002  . Td 02/29/2004, 09/25/2015  . Zoster 10/28/2004   There are no preventive care reminders to display for this patient.  Patient Care Team: Marin Olp, MD as PCP - General (Family Medicine)  Indicate any recent Medical Services you may have received from other than [facility] providers in the past year (date may be approximate).     Assessment:   This is a routine wellness examination for Lori Sharp. Physical assessment deferred to PCP.   Hearing/Vision screen Hearing Screening Comments: Able to hear conversational tones w/o difficulty. No issues reported.   Vision Screening Comments: Wears bifocals. Pt states she is due for an eye exam.  Dietary issues and exercise activities discussed: Current Exercise Habits: The patient does not participate in regular exercise at present (Pt states that she has an exercise room in her apartment complex that she will start going to.), Exercise limited by: None identified  Diet (meal preparation, eat out, water intake, caffeinated beverages, dairy products, fruits and vegetables): 1-2  meals/day. Drinks 3 8oz bottles of water/day.  Breakfast: Grits or oatmeal. 1 cup coffee. Lunch: [place] sandwich. Dinner:    Apple or peach. Pt states that she has noticed a decline in her appetite but nothing acute. She states this has been going on as she has gotten older.  Goals    . Maintain current health status      Depression Screen PHQ 2/9 Scores 07/17/2016 07/18/2015 02/13/2014 01/09/2014 12/26/2013 11/21/2013  PHQ - 2 Score 6 2 5 3 5 6   PHQ- 9 Score 13 4 12 8 15 16   Pt states that she is feeling better on her current medication regimen.    Fall Risk Fall Risk  07/17/2016 07/18/2015 01/09/2014  Falls in the past year? Yes No No  Number falls in past yr: 2 or more - -  Injury with Fall? No - -  Risk Factor Category  High Fall Risk - -  Risk for fall due to : History of fall(s) - History of fall(s)  Follow up Education provided;Falls prevention discussed - -    Cognitive Function: MMSE - Mini Mental State Exam 07/17/2016  Orientation to time 5  Orientation to Place 4  Registration 3  Attention/ Calculation 5  Recall 1  Language- name 2 objects 2  Language- repeat 1  Language- follow 3 step command 3  Language- read & follow direction 1  Write a sentence 1  Copy design 1  Total score 27        Screening Tests Health Maintenance  Topic Date Due  . INFLUENZA VACCINE  08/27/2016  . TETANUS/TDAP  09/24/2025  . DEXA SCAN  Completed  . PNA vac Low Risk Adult  Completed      Plan:    PHQ 9 score: 13. Pt states that she is feeling better on her current medication regimen. Denies suicidal thoughts. Pt would like Dr Yong Channel to look at her legs due to thinning skin. Pt will start doing brain stimulating activities to improve memory.  Pt will call Gynecologist to get orders for mammogram and bone density as suggested by MD.  Follow up PCP as scheduled.   I have personally reviewed and noted the following in the patient's chart:   . Medical and social history . Use  of alcohol, tobacco or illicit drugs  . Current medications and supplements . Functional ability and status . Nutritional status . Physical activity . Advanced directives . List of other physicians . Hospitalizations, surgeries, and ER visits in previous 12 months . Vitals . Screenings to include cognitive, depression, and falls . Referrals and appointments  In addition, I have reviewed and discussed with patient certain preventive protocols, quality metrics, and best practice recommendations. A written personalized care plan for preventive services as well as general preventive health recommendations were provided to patient.     Ree Edman, RN   07/17/2016   I have reviewed and agree with note, evaluation, plan. See my separate note about thin skin, mood, falls and dizziness  Garret Reddish, MD

## 2016-07-17 ENCOUNTER — Encounter: Payer: Self-pay | Admitting: Family Medicine

## 2016-07-17 ENCOUNTER — Ambulatory Visit (INDEPENDENT_AMBULATORY_CARE_PROVIDER_SITE_OTHER): Payer: Medicare Other | Admitting: Family Medicine

## 2016-07-17 VITALS — BP 122/60 | HR 78 | Temp 98.6°F | Resp 16 | Ht 61.0 in | Wt 139.6 lb

## 2016-07-17 DIAGNOSIS — F324 Major depressive disorder, single episode, in partial remission: Secondary | ICD-10-CM | POA: Diagnosis not present

## 2016-07-17 DIAGNOSIS — I10 Essential (primary) hypertension: Secondary | ICD-10-CM | POA: Diagnosis not present

## 2016-07-17 DIAGNOSIS — E785 Hyperlipidemia, unspecified: Secondary | ICD-10-CM | POA: Diagnosis not present

## 2016-07-17 DIAGNOSIS — Z0000 Encounter for general adult medical examination without abnormal findings: Secondary | ICD-10-CM

## 2016-07-17 NOTE — Assessment & Plan Note (Signed)
S:PHQ9 above 10 but patient states this is about the best her spirits have been in sometime. Sh eis not having crying spells. No SI. Pleased with care from Dr. Clovis Pu A/P: continue current medications - zoloft 100mg  and abilify

## 2016-07-17 NOTE — Patient Instructions (Addendum)
No change in blood pressure medicine. Blood pressure has come down and you seem to have gotten used to the medicine. If you have worsening issues with lightheadedness please let us know immediately particularly if you have falls  Try vaseline on the legs once a day in addition to your lotion about 12 hours aprart   Lori Sharp , Thank you for taking time to come for your Medicare Wellness Visit. I appreciate your ongoing commitment to your health goals. Please review the following plan we discussed and let me know if I can assist you in the future.   These are the goals we discussed: 1. Start doing brain stimulating activities. Call your Gynecologist to schedule your mammogram and bone density exams.   This is a list of the screening recommended for you and due dates:  Health Maintenance  Topic Date Due  . Flu Shot  08/27/2016  . Tetanus Vaccine  09/24/2025  . DEXA scan (bone density measurement)  Completed  . Pneumonia vaccines  Completed

## 2016-07-17 NOTE — Assessment & Plan Note (Signed)
S: controlled on amlodipine 5mg . Home cuff verified.  Since increase in amlodipine to 5mg - Home #s started 140s and 150s and now down into 120s and 130s. Had a period where when she put her head down gets very dizzy and even had a fall 4-5 weeks ago with this. Getting much better with no falls and only slightly lightheaded if bends head over then stands quickly. Does a good job of staying hydrated with 16 0z bottles drinks 3-4 a day.  ASCVD 10 year risk calculation if age 6-79: elevated but on statin BP Readings from Last 3 Encounters:  07/17/16 122/60  06/27/16 (!) 158/72  06/24/16 (!) 168/80  A/P: We discussed blood pressure goal of <140/90 due to CKD. Honestly I would tolerate up to 150 despite CKD but patient on 2.5mg  amlodipine has BP even higher than that Continue current meds:  Amlodipine 2.5mg  as long as does not have recurrence of more severe dizziness with bending over and falls. Home cuff reasonable even though wrist cuff

## 2016-07-17 NOTE — Assessment & Plan Note (Signed)
S: once again hopefully controlled on atorvastatin 20mg . No myalgias.   A/P: had planned to get lipids today, we will plan to repeat next visit.

## 2016-07-17 NOTE — Progress Notes (Signed)
Subjective:  Lori Sharp is a 76 y.o. year old very pleasant female patient who presents for/with See problem oriented charting ROS- no fever, chills. No headache or blurry vision. Lightheaded if bends head down then comes up quickly   Past Medical History-  Patient Active Problem List   Diagnosis Date Noted  . Generalized anxiety disorder 11/24/2013    Priority: High  . Depression 09/23/2013    Priority: High  . History of melanoma 09/23/2013    Priority: High  . Hypertension 09/23/2013    Priority: Medium  . Hyperlipidemia 09/23/2013    Priority: Medium  . CKD (chronic kidney disease), stage III 09/23/2013    Priority: Medium  . Hyperglycemia 09/23/2013    Priority: Medium  . Osteopenia 08/14/2014    Priority: Low  . Bright red blood per rectum 12/01/2013    Priority: Low  . Frequent headaches 11/21/2013    Priority: Low  . Arthritis 11/21/2013    Priority: Low  . 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 Loss of weight 02/13/2014    Medications- reviewed and updated Current Outpatient Prescriptions  Medication Sig Dispense Refill  . ARIPiprazole (ABILIFY) 5 MG tablet Take 5 mg by mouth daily. Through psychiatry    . aspirin 81 MG tablet Take 81 mg by mouth every other day.    Marland Kitchen atorvastatin (LIPITOR) 20 MG tablet Take 1 tablet (20 mg total) by mouth daily. 30 tablet 5  . Naproxen Sodium (ALEVE PO) Take by mouth as needed.    . sertraline (ZOLOFT) 100 MG tablet Take 1 tablet (100 mg total) by mouth daily. 30 tablet 11  . amLODipine (NORVASC) 5 MG tablet Take 1 tablet (5 mg total) by mouth daily. (Patient not taking: Reported on 07/17/2016) 30 tablet 5   No current facility-administered medications for this visit.     Objective: BP 122/60 (BP Location: Left Arm, Patient Position: Sitting, Cuff Size: Normal)   Pulse 78   Temp 98.6 F (37 C) (Oral)   Resp 16   Ht 5\' 1"  (1.549 m)   Wt 139 lb 9.6 oz (63.3 kg)   SpO2 97%   BMI 26.38 kg/m  Gen:  NAD, resting comfortably CV: RRR no murmurs rubs or gallops Lungs: CTAB no crackles, wheeze, rhonchi Abdomen: soft/nontender/nondistended/normal bowel sounds.  Ext: no edema Skin: warm, dry, some seborrheic keratosis on bilateral legs Neuro: grossly normal, moves all extremities  Assessment/Plan:  Also mentions dry legs- has some seborrheic keratosis as well- we discussed vaseline daily as an emmolient should be considered  Hypertension S: controlled on amlodipine 5mg . Home cuff verified.  Since increase in amlodipine to 5mg - Home #s started 140s and 150s and now down into 120s and 130s. Had a period where when she put her head down gets very dizzy and even had a fall 4-5 weeks ago with this. Getting much better with no falls and only slightly lightheaded if bends head over then stands quickly. Does a good job of staying hydrated with 16 0z bottles drinks 3-4 a day.  ASCVD 10 year risk calculation if age 11-79: elevated but on statin BP Readings from Last 3 Encounters:  07/17/16 122/60  06/27/16 (!) 158/72  06/24/16 (!) 168/80  A/P: We discussed blood pressure goal of <140/90 due to CKD. Honestly I would tolerate up to 150 despite CKD but patient on 2.5mg  amlodipine has BP even higher than that Continue current meds:  Amlodipine 2.5mg  as long as does not  have recurrence of more severe dizziness with bending over and falls. Home cuff reasonable even though wrist cuff  Depression S:PHQ9 above 10 but patient states this is about the best her spirits have been in sometime. Sh eis not having crying spells. No SI. Pleased with care from Dr. Clovis Pu A/P: continue current medications - zoloft 100mg  and abilify   Hyperlipidemia S: once again hopefully controlled on atorvastatin 20mg . No myalgias.   A/P: had planned to get lipids today, we will plan to repeat next visit.   we discussed 3-6 month follow up verbally  Return precautions advised.  Garret Reddish, MD

## 2016-09-23 ENCOUNTER — Ambulatory Visit (INDEPENDENT_AMBULATORY_CARE_PROVIDER_SITE_OTHER): Payer: Medicare Other | Admitting: Podiatry

## 2016-09-23 ENCOUNTER — Encounter: Payer: Self-pay | Admitting: Podiatry

## 2016-09-23 DIAGNOSIS — M79674 Pain in right toe(s): Secondary | ICD-10-CM

## 2016-09-23 DIAGNOSIS — M79675 Pain in left toe(s): Secondary | ICD-10-CM

## 2016-09-23 DIAGNOSIS — B351 Tinea unguium: Secondary | ICD-10-CM

## 2016-09-23 NOTE — Progress Notes (Signed)
Patient ID: Lori Sharp, female   DOB: 1940/08/10, 76 y.o.   MRN: [id number]   Subjective: This patient presents today complaining of thickened and elongated, uncomfortable toenails and requests toenail debridement. Patient states that the nails of gradually thickened more so the 4-6 months patient is no longer able to trim toenails. She denies any other care then attempted self treatment. She denies any history of infection around the toenails. Patient has for a friend present in the treatment room  Patient denies smoking history  Objective: Was at Patient appears responsive and able to respond to questioning, orientated 3  Vascular: DP and PT pulses 2/4 bilaterally Capillary reflex immediate bilaterally  Neurological: Sensation is 10 g monofilament wire intact 4/5 bilaterally Vibratory sensation reactive bilaterally Ankle reflexes reactive bilaterally  Dermatological: No open skin lesions bilaterally Atrophic skin bilaterally The toenails are extremely elongated, deformed, discolored, hypertrophic and tender to direct palpation 6-10  Musculoskeletal: Hammertoe second left Manual motor testing dorsi flexion, plantar flexion, inversion, eversion 5/5 bilaterally  Assessment: Satisfactory neurovascular status 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

## 2016-10-30 ENCOUNTER — Ambulatory Visit (INDEPENDENT_AMBULATORY_CARE_PROVIDER_SITE_OTHER): Payer: Medicare Other

## 2016-10-30 DIAGNOSIS — Z23 Encounter for immunization: Secondary | ICD-10-CM | POA: Diagnosis not present

## 2016-11-04 ENCOUNTER — Encounter: Payer: Self-pay | Admitting: Family Medicine

## 2016-12-30 ENCOUNTER — Encounter: Payer: Self-pay | Admitting: Podiatry

## 2016-12-30 ENCOUNTER — Ambulatory Visit (INDEPENDENT_AMBULATORY_CARE_PROVIDER_SITE_OTHER): Payer: Medicare Other | Admitting: Podiatry

## 2016-12-30 DIAGNOSIS — M79674 Pain in right toe(s): Secondary | ICD-10-CM

## 2016-12-30 DIAGNOSIS — M79675 Pain in left toe(s): Secondary | ICD-10-CM | POA: Diagnosis not present

## 2016-12-30 DIAGNOSIS — B351 Tinea unguium: Secondary | ICD-10-CM | POA: Diagnosis not present

## 2016-12-30 NOTE — Progress Notes (Signed)
Patient ID: Lori Sharp, female   DOB: Dec 12, 1940, 76 y.o.   MRN: [id number]   Subjective: This patient presents today complaining of thickened and elongated, uncomfortable toenails and requests toenail debridement. Patient states that the nails of gradually thickened more so the 4-6 months patient is no longer able to trim toenails. She denies any other care then attempted self treatment.She denies any history of infection around the toenails. Patient has for a friend present in the treatment room  Patient denies smoking history  Objective: Was at Patient appears responsive and able to respond to questioning,orientated 3  Vascular: DP and PT pulses 2/4 bilaterally Capillary reflex immediate bilaterally  Neurological: Sensation is 10 g monofilament wire intact 4/5 bilaterally Vibratory sensation reactive bilaterally Ankle reflexes reactive bilaterally  Dermatological: No open skin lesions bilaterally Atrophic skin bilaterally The toenails are extremely elongated, deformed, discolored, hypertrophic and tender to direct palpation 6-10  Musculoskeletal: Hammertoe second left Manual motor testing dorsi flexion, plantar flexion, inversion, eversion 5/5 bilaterally  Assessment: Satisfactory neurovascular status symptomatic mycotic toenails 6-10 Symptomatic mycotic toenails 6-10  Plan:  The toenails 6-10 were debrided mechanically and electrically without any bleeding  Reappoint 3 months

## 2017-03-04 ENCOUNTER — Telehealth: Payer: Self-pay | Admitting: Family Medicine

## 2017-03-04 NOTE — Telephone Encounter (Signed)
Please contact patient or POA to schedule for an office visit for dates 2/21 or 2/22. Okay to use same day slots.

## 2017-03-04 NOTE — Telephone Encounter (Signed)
Please advise if we can accommodate the preferences for scheduling the appointment.   Copied from [place]. Topic: Appointment Scheduling - Prior Auth Required for Appointment >> Mar 04, 2017 12:45 PM Pilar Grammes F wrote: No appointment has been scheduled. Patient's son Laurey Arrow [phone], who is the patient's POA is requesting an appt on 03/19/17 or 03/20/17, because he has to fly in for the appt.  The only appts Dr. Yong Channel has left on those days are same day appts.  Please advise if one of those can be used for the patient since he has to fly in to make her appt.

## 2017-03-04 NOTE — Telephone Encounter (Signed)
Patient is scheduled   

## 2017-03-04 NOTE — Telephone Encounter (Signed)
Okay to schedule pt in same day appointment.

## 2017-03-19 ENCOUNTER — Ambulatory Visit: Payer: Medicare Other | Admitting: Family Medicine

## 2017-03-19 ENCOUNTER — Ambulatory Visit (INDEPENDENT_AMBULATORY_CARE_PROVIDER_SITE_OTHER): Payer: Medicare Other

## 2017-03-19 ENCOUNTER — Encounter: Payer: Self-pay | Admitting: Family Medicine

## 2017-03-19 VITALS — BP 120/70 | HR 74 | Temp 98.4°F | Ht 61.0 in | Wt 140.2 lb

## 2017-03-19 DIAGNOSIS — Z85820 Personal history of malignant melanoma of skin: Secondary | ICD-10-CM | POA: Diagnosis not present

## 2017-03-19 DIAGNOSIS — F411 Generalized anxiety disorder: Secondary | ICD-10-CM

## 2017-03-19 DIAGNOSIS — I10 Essential (primary) hypertension: Secondary | ICD-10-CM | POA: Diagnosis not present

## 2017-03-19 DIAGNOSIS — M79605 Pain in left leg: Secondary | ICD-10-CM

## 2017-03-19 DIAGNOSIS — M79604 Pain in right leg: Secondary | ICD-10-CM

## 2017-03-19 DIAGNOSIS — F330 Major depressive disorder, recurrent, mild: Secondary | ICD-10-CM

## 2017-03-19 DIAGNOSIS — R413 Other amnesia: Secondary | ICD-10-CM

## 2017-03-19 DIAGNOSIS — E785 Hyperlipidemia, unspecified: Secondary | ICD-10-CM

## 2017-03-19 LAB — POC URINALSYSI DIPSTICK (AUTOMATED)
Bilirubin, UA: NEGATIVE
Glucose, UA: NEGATIVE
KETONES UA: NEGATIVE
Leukocytes, UA: NEGATIVE
Nitrite, UA: NEGATIVE
PH UA: 5 (ref 5.0–8.0)
Protein, UA: NEGATIVE
RBC UA: NEGATIVE
SPEC GRAV UA: 1.02 (ref 1.010–1.025)
UROBILINOGEN UA: 0.2 U/dL

## 2017-03-19 LAB — CBC
HCT: 41.5 % (ref 36.0–46.0)
Hemoglobin: 13.7 g/dL (ref 12.0–15.0)
MCHC: 33.1 g/dL (ref 30.0–36.0)
MCV: 86.4 fl (ref 78.0–100.0)
Platelets: 248 10*3/uL (ref 150.0–400.0)
RBC: 4.8 Mil/uL (ref 3.87–5.11)
RDW: 14.2 % (ref 11.5–15.5)
WBC: 7.2 10*3/uL (ref 4.0–10.5)

## 2017-03-19 LAB — COMPREHENSIVE METABOLIC PANEL
ALT: 13 U/L (ref 0–35)
AST: 17 U/L (ref 0–37)
Albumin: 4.7 g/dL (ref 3.5–5.2)
Alkaline Phosphatase: 78 U/L (ref 39–117)
BILIRUBIN TOTAL: 0.6 mg/dL (ref 0.2–1.2)
BUN: 17 mg/dL (ref 6–23)
CO2: 29 meq/L (ref 19–32)
CREATININE: 0.97 mg/dL (ref 0.40–1.20)
Calcium: 9.9 mg/dL (ref 8.4–10.5)
Chloride: 101 mEq/L (ref 96–112)
GFR: 58.88 mL/min — ABNORMAL LOW (ref >60.00)
GLUCOSE: 98 mg/dL (ref 70–99)
Potassium: 4.2 mEq/L (ref 3.5–5.1)
SODIUM: 140 meq/L (ref 135–145)
Total Protein: 7.4 g/dL (ref 6.0–8.3)

## 2017-03-19 LAB — VITAMIN B12: VITAMIN B 12: 227 pg/mL (ref 211–911)

## 2017-03-19 LAB — TSH: TSH: 3.04 u[IU]/mL (ref 0.35–4.50)

## 2017-03-19 LAB — LDL CHOLESTEROL, DIRECT: LDL DIRECT: 127 mg/dL

## 2017-03-19 MED ORDER — AMLODIPINE BESYLATE 2.5 MG PO TABS: 2.5000 mg | ORAL_TABLET | Freq: Every day | ORAL | 3 refills | Status: AC

## 2017-03-19 NOTE — Assessment & Plan Note (Signed)
S: poorly  controlled on last check  On pravastatin 40mg  - she is on atorvastatin 20mg now .and we will recheck. No myalgias.   A/P: update LDL today

## 2017-03-19 NOTE — Assessment & Plan Note (Signed)
S: controlled on amlodipine 2.5 mg  BP Readings from Last 3 Encounters:  03/19/17 120/70  07/17/16 122/60  06/27/16 (!) 158/72  A/P: We discussed blood pressure goal of <140/90- with orthostatic history could tolerate up to 150/90. Continue amlodipien 2.5mg . Had been taking half of 5 mg- we changed to 2.5mg  pill

## 2017-03-19 NOTE — Assessment & Plan Note (Signed)
With history of melanoma and upper tibia pain (though bilateral) will get films to evaluate bony metastasis. Doubt with it being bilateral. Advised heating pad before bed as pain only at bedtime. Also advised tylenol if that doesn't work and bending legs slightly as pain worse with legs being fully extended

## 2017-03-19 NOTE — Patient Instructions (Addendum)
I doubt depression is cause of memory loss- regardless poorly controlled- advised follow up with Dr. Clovis Pu. I would mention to him that you are being evaluated for likely dementia  Please stop by lab before you go. They will also do an x-ray duet o melanoma history. Also checking in on cholesterol.   We will call you within a week or two about your referral to neurology. If you do not hear within 3 weeks, give [place] a call.

## 2017-03-19 NOTE — Assessment & Plan Note (Signed)
GAD S: PHQ9 of 8. GAD7 of 8 (will be input electronically). She was not having crying spells. Still seeing Dr. Clovis Pu. ON zoloft 100mg  and abilify 5 mg- thinks the abilify was reduced to half dose.  A/P: poor control of anxiety and depression.  I doubt depression is cause of memory loss- regardless poorly controlled- advised follow up with Dr. Clovis Pu. I would mention to him that you are being evaluated for likely dementia

## 2017-03-19 NOTE — Progress Notes (Signed)
Subjective:  Lori Sharp is a 77 y.o. year old very pleasant female patient who presents for/with See problem oriented charting ROS- memory loss reported. No fever or chills. No facial or extremity weakness. No slurred words or trouble swallowing. no blurry vision or double vision. No paresthesias. Does at times have confusion and word finding difficulties.    Past Medical History-  Patient Active Problem List   Diagnosis Date Noted  . Memory loss 03/19/2017    Priority: High  . Generalized anxiety disorder 11/24/2013    Priority: High  . Depression, major, recurrent, mild ([place]) 09/23/2013    Priority: High  . History of melanoma 09/23/2013    Priority: High  . Hypertension 09/23/2013    Priority: Medium  . Hyperlipidemia 09/23/2013    Priority: Medium  . CKD (chronic kidney disease), stage III ([place]) 09/23/2013    Priority: Medium  . Hyperglycemia 09/23/2013    Priority: Medium  . Osteopenia 08/14/2014    Priority: Low  . Bright red blood per rectum 12/01/2013    Priority: Low  . Frequent headaches 11/21/2013    Priority: Low  . Arthritis 11/21/2013    Priority: Low  . 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 Loss of weight 02/13/2014    Medications- reviewed and updated Current Outpatient Medications  Medication Sig Dispense Refill  . amLODipine (NORVASC) 2.5 MG tablet Take 1 tablet (2.5 mg total) by mouth daily. 90 tablet 3  . ARIPiprazole (ABILIFY) 5 MG tablet Take 5 mg by mouth daily. Through psychiatry    . aspirin 81 MG tablet Take 81 mg by mouth every other day.    Marland Kitchen atorvastatin (LIPITOR) 20 MG tablet Take 1 tablet (20 mg total) by mouth daily. 30 tablet 5  . sertraline (ZOLOFT) 100 MG tablet Take 1 tablet (100 mg total) by mouth daily. 30 tablet 11   No current facility-administered medications for this visit.     Objective: BP 120/70 (BP Location: Left Arm, Patient Position: Sitting, Cuff Size: Large)   Pulse 74   Temp 98.4 F (36.9 C)  (Oral)   Ht 5\' 1"  (1.549 m)   Wt 140 lb 3.2 oz (63.6 kg)   SpO2 98%   BMI 26.49 kg/m  Gen: NAD, resting comfortably CV: RRR no murmurs rubs or gallops Lungs: CTAB no crackles, wheeze, rhonchi Abdomen: soft/nontender/nondistended/normal bowel sounds. No rebound or guarding.  Ext: no edema Skin: warm, dry Neuro: CN II-XII intact, sensation and reflexes normal throughout, 5/5 muscle strength in bilateral upper and lower extremities. Normal finger to nose. Normal rapid alternating movements. No pronator drift. Normal romberg. Normal gait.    Assessment/Plan:  Leg pain, bilateral - Plan: DG Knee Bilateral Standing AP History of melanoma S: bilateral pain along upper portion of lower l egs when laying down at night with legs straight.  A/P: with melanoma history will get x-rays to rule out bony metastasis. Use heat at night. If not effective- tylenol before bed.   Memory loss - Plan: LDL cholesterol, direct, CBC, Comprehensive metabolic panel, HIV antibody, RPR, TSH, Vitamin B12, MR Brain W Wo Contrast, Ambulatory referral to Neurology S: has started to seem more confused and forgetful within the last year. Sons are getting calls from neighbors about mom being confused. She has noticed she feels more confused. Has some tremors with anxiety- no resting tremor.  A/P: MMSE today 21/30. Refer to neurology. Reversible cause of dementia workup planned as per labs below. Doubt depression/anxiety  as cause of memory loss but could contribute. Neuro exam reassuring today - family considering moving her to [place] depending on workup but lease ends sept/october so has some time.    Hypertension S: controlled on amlodipine 2.5 mg  BP Readings from Last 3 Encounters:  03/19/17 120/70  07/17/16 122/60  06/27/16 (!) 158/72  A/P: We discussed blood pressure goal of <140/90- with orthostatic history could tolerate up to 150/90. Continue amlodipien 2.5mg . Had been taking half of 5 mg- we changed to 2.5mg   pill  Depression, major, recurrent, mild ([facility]) GAD S: PHQ9 of 8. GAD7 of 8 (will be input electronically). She was not having crying spells. Still seeing Dr. Clovis Pu. ON zoloft 100mg  and abilify 5 mg- thinks the abilify was reduced to half dose.  A/P: poor control of anxiety and depression.  I doubt depression is cause of memory loss- regardless poorly controlled- advised follow up with Dr. Clovis Pu. I would mention to him that you are being evaluated for likely dementia  Hyperlipidemia S: poorly  controlled on last check  On pravastatin 40mg  - she is on atorvastatin 20mg now .and we will recheck. No myalgias.   A/P: update LDL today  History of melanoma With history of melanoma and upper tibia pain (though bilateral) will get films to evaluate bony metastasis. Doubt with it being bilateral. Advised heating pad before bed as pain only at bedtime. Also advised tylenol if that doesn't work and bending legs slightly as pain worse with legs being fully extended  Future Appointments  Date Time Provider [place]  03/31/2017  8:45 AM Tuchman, Leslye Peer, DPM [facility]   2 month follow up with me to check in on how things are going. Contact us immediately if any thoughts of self harm communicated to patient.   Lab/Order associations: Leg pain, bilateral - Plan: DG Knee Bilateral Standing AP  Memory loss - Plan: LDL cholesterol, direct, CBC, Comprehensive metabolic panel, HIV antibody, RPR, TSH, Vitamin B12, MR Brain W Wo Contrast, Ambulatory referral to Neurology  Essential hypertension - Plan: POCT Urinalysis Dipstick (Automated)  Depression, major, recurrent, mild (HCC)  Hyperlipidemia, unspecified hyperlipidemia type - Plan: LDL cholesterol, direct, CBC, Comprehensive metabolic panel, TSH, POCT Urinalysis Dipstick (Automated)   Meds ordered this encounter  Medications  . amLODipine (NORVASC) 2.5 MG tablet    Sig: Take 1 tablet (2.5 mg total) by mouth daily.    Dispense:   90 tablet    Refill:  3   Time Stamp The duration of face-to-face time during this visit was greater than 40 minutes. Greater than 50% of this time was spent in counseling, explanation of diagnosis, planning of further management, and/or coordination of care including discussion future disposition for living, discussing potential causes, going through meaning of MMSE, discussing workup, and next steps.   Return precautions advised.  Garret Reddish, MD

## 2017-03-19 NOTE — Addendum Note (Signed)
Addended by: Frutoso Chase A on: 03/19/2017 12:44 PM   Modules accepted: Orders

## 2017-03-20 LAB — HIV ANTIBODY (ROUTINE TESTING W REFLEX): HIV 1&2 Ab, 4th Generation: NONREACTIVE

## 2017-03-20 LAB — RPR: RPR Ser Ql: NONREACTIVE

## 2017-03-24 ENCOUNTER — Encounter: Payer: Self-pay | Admitting: Neurology

## 2017-03-31 ENCOUNTER — Other Ambulatory Visit: Payer: Self-pay

## 2017-03-31 ENCOUNTER — Ambulatory Visit: Payer: Medicare Other | Admitting: Podiatry

## 2017-04-03 ENCOUNTER — Ambulatory Visit
Admission: RE | Admit: 2017-04-03 | Discharge: 2017-04-03 | Disposition: A | Discharge status: Home / Self Care | Payer: Medicare Other | Source: Ambulatory Visit | Attending: Family Medicine | Admitting: Family Medicine

## 2017-04-03 DIAGNOSIS — R413 Other amnesia: Secondary | ICD-10-CM

## 2017-04-05 ENCOUNTER — Encounter: Payer: Self-pay | Admitting: Family Medicine

## 2017-04-07 ENCOUNTER — Telehealth: Payer: Self-pay | Admitting: Family Medicine

## 2017-04-07 NOTE — Telephone Encounter (Signed)
I understand. I'm so sorry to hear she continues to not do well but I'm thrilled she has such a good support system. I have written a letter. Sometimes people can see this on mychart. If you cannot- Roselyn Reef can help by sending you a copy. I am ok with Roselyn Reef altering the letter and just sending to me for confirmation if needed.   I am not available by phone at the moment as I am out on paternity leave- will be back in about 2 weeks.   Garret Reddish

## 2017-04-07 NOTE — Telephone Encounter (Signed)
See note.   Copied from [place]. Topic: Quick Communication - Office Called Patient >> Apr 07, 2017 12:49 PM Yvette Rack wrote: Reason for CRM: patient son Lori Sharp calling stating that someone called him about a letter that can be faxed to him his fax number is [phone] or a e-mail that's directly to him because he is a lawyer so its confidential [email or web address]

## 2017-04-07 NOTE — Telephone Encounter (Signed)
Letter faxed as requested

## 2017-05-13 ENCOUNTER — Encounter: Payer: Self-pay | Admitting: Family Medicine

## 2017-05-18 ENCOUNTER — Ambulatory Visit: Payer: Medicare Other | Admitting: Family Medicine

## 2017-06-09 ENCOUNTER — Ambulatory Visit: Payer: Self-pay | Admitting: Neurology

## 2019-06-05 IMAGING — DX DG KNEE STANDING AP BILAT
1 series · 1 of 1 positions shown · non-contrast
Comparison: None.

CLINICAL DATA: Upper bilateral knee and tibial pain. History of
melanoma. Pain only at night.

EXAM:
BILATERAL KNEES STANDING - 1 VIEW

[knee standing ap]
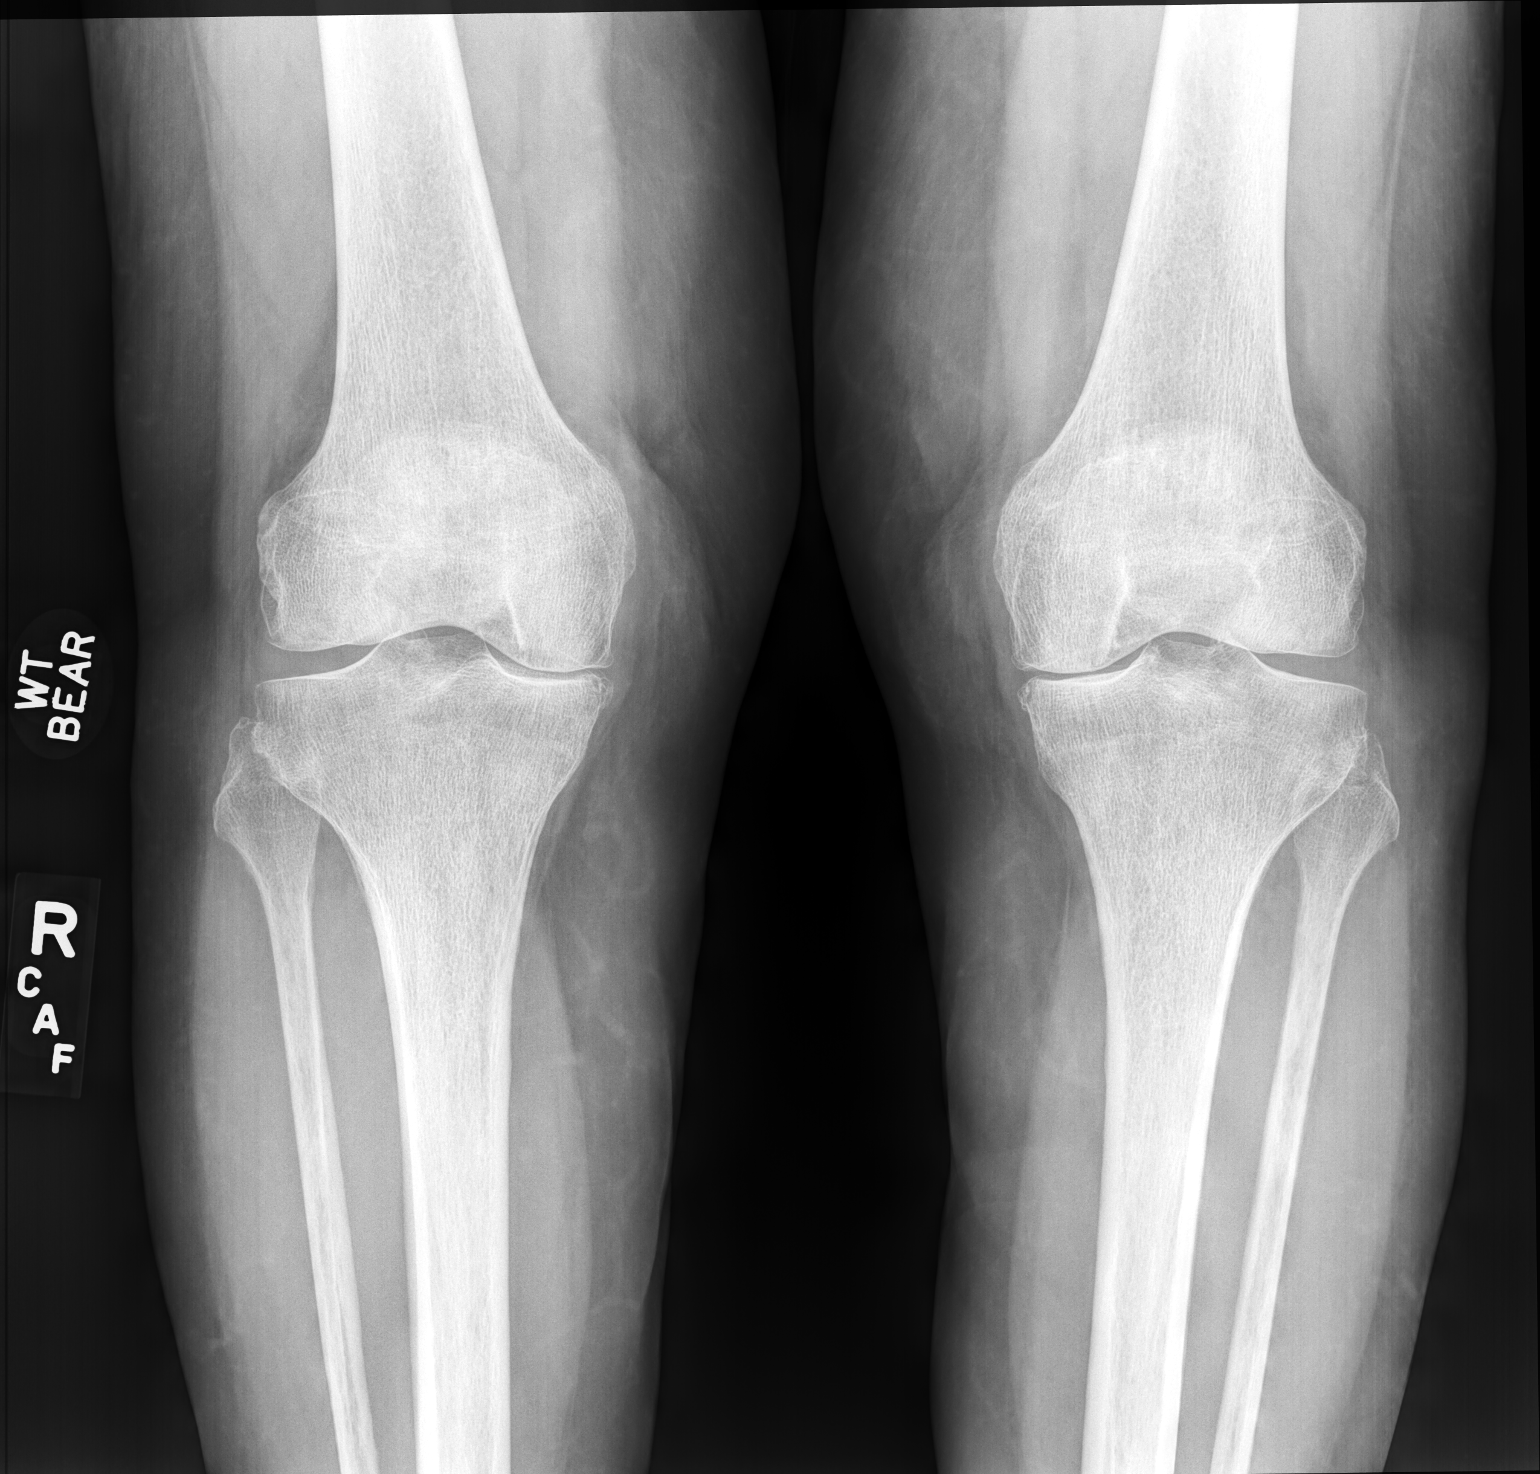

[1 of 1 positions shown; findings below may reference images not displayed]

FINDINGS: Examination demonstrates moderate joint space narrowing of the
medial compartment with minimal spurring. No evidence of fracture or
dislocation. No focal lytic or sclerotic lesion.
IMPRESSION: No evidence to suggest metastatic disease.

Mild-to-moderate degenerative change of the medial compartment
bilaterally.
